# Patient Record
Sex: Female | Born: 1950 | Race: White | Hispanic: No | Smoking: Never smoker
Health system: Southern US, Community
[De-identification: ages and names within clinical notes are randomized; demographics above are authoritative.]

## PROBLEM LIST (undated history)

## (undated) DIAGNOSIS — M546 Pain in thoracic spine: Secondary | ICD-10-CM

## (undated) DIAGNOSIS — I10 Essential (primary) hypertension: Secondary | ICD-10-CM

## (undated) DIAGNOSIS — R112 Nausea with vomiting, unspecified: Secondary | ICD-10-CM

## (undated) DIAGNOSIS — G4700 Insomnia, unspecified: Secondary | ICD-10-CM

## (undated) DIAGNOSIS — I639 Cerebral infarction, unspecified: Secondary | ICD-10-CM

## (undated) DIAGNOSIS — G8929 Other chronic pain: Secondary | ICD-10-CM

## (undated) DIAGNOSIS — I6521 Occlusion and stenosis of right carotid artery: Secondary | ICD-10-CM

## (undated) DIAGNOSIS — Z9889 Other specified postprocedural states: Secondary | ICD-10-CM

## (undated) DIAGNOSIS — I319 Disease of pericardium, unspecified: Secondary | ICD-10-CM

## (undated) DIAGNOSIS — F419 Anxiety disorder, unspecified: Secondary | ICD-10-CM

## (undated) DIAGNOSIS — E785 Hyperlipidemia, unspecified: Secondary | ICD-10-CM

## (undated) DIAGNOSIS — K219 Gastro-esophageal reflux disease without esophagitis: Secondary | ICD-10-CM

## (undated) DIAGNOSIS — R42 Dizziness and giddiness: Secondary | ICD-10-CM

## (undated) DIAGNOSIS — M259 Joint disorder, unspecified: Secondary | ICD-10-CM

## (undated) DIAGNOSIS — Z860100 Personal history of colon polyps, unspecified: Secondary | ICD-10-CM

## (undated) DIAGNOSIS — K5790 Diverticulosis of intestine, part unspecified, without perforation or abscess without bleeding: Secondary | ICD-10-CM

## (undated) DIAGNOSIS — K760 Fatty (change of) liver, not elsewhere classified: Secondary | ICD-10-CM

## (undated) DIAGNOSIS — M51369 Other intervertebral disc degeneration, lumbar region without mention of lumbar back pain or lower extremity pain: Secondary | ICD-10-CM

## (undated) DIAGNOSIS — M542 Cervicalgia: Secondary | ICD-10-CM

## (undated) DIAGNOSIS — M8580 Other specified disorders of bone density and structure, unspecified site: Secondary | ICD-10-CM

## (undated) DIAGNOSIS — K5900 Constipation, unspecified: Secondary | ICD-10-CM

## (undated) DIAGNOSIS — M5020 Other cervical disc displacement, unspecified cervical region: Secondary | ICD-10-CM

## (undated) DIAGNOSIS — G2581 Restless legs syndrome: Secondary | ICD-10-CM

## (undated) DIAGNOSIS — Z973 Presence of spectacles and contact lenses: Secondary | ICD-10-CM

## (undated) DIAGNOSIS — C4490 Unspecified malignant neoplasm of skin, unspecified: Secondary | ICD-10-CM

## (undated) HISTORY — PX: EYE SURGERY: SHX253

## (undated) HISTORY — PX: TUBAL LIGATION: SHX77

## (undated) HISTORY — PX: APPENDECTOMY: SHX54

## (undated) HISTORY — PX: ELBOW SURGERY: SHX618

## (undated) HISTORY — PX: ABDOMINAL HYSTERECTOMY: SHX81

## (undated) HISTORY — DX: Pain in thoracic spine: M54.6

## (undated) HISTORY — PX: WRIST SURGERY: SHX841

## (undated) HISTORY — PX: ABDOMINAL HYSTERECTOMY W/ PARTIAL VAGINACTOMY: SUR660

---

## 1980-03-28 HISTORY — PX: TUBAL LIGATION: SHX77

## 1988-09-26 HISTORY — PX: ABDOMINAL HYSTERECTOMY: SHX81

## 2005-08-29 ENCOUNTER — Other Ambulatory Visit: Payer: Self-pay

## 2005-08-29 ENCOUNTER — Emergency Department: Payer: Self-pay | Admitting: Emergency Medicine

## 2006-08-15 ENCOUNTER — Emergency Department: Payer: Self-pay | Admitting: Emergency Medicine

## 2007-01-11 ENCOUNTER — Ambulatory Visit: Payer: Self-pay | Admitting: Gastroenterology

## 2007-07-06 ENCOUNTER — Ambulatory Visit: Payer: Self-pay | Admitting: Unknown Physician Specialty

## 2007-07-13 ENCOUNTER — Ambulatory Visit: Payer: Self-pay | Admitting: Unknown Physician Specialty

## 2007-09-27 HISTORY — PX: ELBOW SURGERY: SHX618

## 2010-06-06 HISTORY — PX: WRIST SURGERY: SHX841

## 2010-09-26 DIAGNOSIS — I319 Disease of pericardium, unspecified: Secondary | ICD-10-CM

## 2010-09-26 HISTORY — DX: Disease of pericardium, unspecified: I31.9

## 2014-04-01 ENCOUNTER — Emergency Department: Payer: Self-pay | Admitting: Emergency Medicine

## 2014-04-01 LAB — BASIC METABOLIC PANEL
ANION GAP: 6 — AB (ref 7–16)
BUN: 14 mg/dL (ref 7–18)
CO2: 30 mmol/L (ref 21–32)
Calcium, Total: 9 mg/dL (ref 8.5–10.1)
Chloride: 105 mmol/L (ref 98–107)
Creatinine: 0.86 mg/dL (ref 0.60–1.30)
EGFR (African American): >60
Glucose: 115 mg/dL — ABNORMAL HIGH (ref 65–99)
OSMOLALITY: 283 (ref 275–301)
Potassium: 3.7 mmol/L (ref 3.5–5.1)
SODIUM: 141 mmol/L (ref 136–145)

## 2014-04-01 LAB — CBC
HCT: 40.3 % (ref 35.0–47.0)
HGB: 13 g/dL (ref 12.0–16.0)
MCH: 29.3 pg (ref 26.0–34.0)
MCHC: 32.3 g/dL (ref 32.0–36.0)
MCV: 91 fL (ref 80–100)
Platelet: 189 10*3/uL (ref 150–440)
RBC: 4.45 10*6/uL (ref 3.80–5.20)
RDW: 14.3 % (ref 11.5–14.5)
WBC: 5.9 10*3/uL (ref 3.6–11.0)

## 2014-04-01 LAB — TROPONIN I

## 2014-04-02 LAB — TROPONIN I
Troponin-I: <0.02 ng/mL
Troponin-I: <0.02 ng/mL

## 2014-04-02 LAB — CK TOTAL AND CKMB (NOT AT ARMC)
CK, TOTAL: 69 U/L
CK, TOTAL: 65 U/L
CK-MB: 1.3 ng/mL (ref 0.5–3.6)
CK-MB: 1.1 ng/mL (ref 0.5–3.6)

## 2015-01-17 NOTE — Discharge Summary (Signed)
PATIENT NAME:  Shannon Petersen, Shannon Petersen MR#:  [id number] DATE OF BIRTH:  March 30, 1951  DATE OF ADMISSION:  04/01/2014 DATE OF DISCHARGE:  04/02/2014  ADMITTING DIAGNOSIS: Chest pain.   DISCHARGE DIAGNOSES: 1.  Chest pain, felt to be noncardiac, possible musculoskeletal or anxiety driven. Negative Lexi MIBI.  2.  Abdominal pain and distention of unclear etiology. CT of the abdomen and pelvis negative.  3.  Generalized anxiety disorder as well as bereavement as a result of loss of her spouse.  4.  History of diverticulosis.  5.  Depression.  6.  History of pericarditis.  7.  Status post hysterectomy.   PERTINENT LABS AND EVALUATIONS: Troponin was less than 0.02.   Chest x-ray showed no active disease. CT of the abdomen and pelvis and CT of the chest showed no evidence of PE. No thoracic aneurysm or dissection. No GI pathology noted.  Admitting glucose 115, BUN 14, creatinine 0.86, sodium 141, potassium 3.7, chloride 105, and CO2 was 30. Calcium was 9. Troponin less than 0.02 x3. WBC 5.9, hemoglobin 13, and platelet count was 189,000.   EKG: Normal sinus rhythm without any ST-T wave changes. Echocardiogram of the heart showed normal EF. Left ventricular diastolic filling was normal. No evidence of pericardial effusion. Trace mitral valve regurg. Trivial tricuspid regurgitation.   HOSPITAL COURSE: Please refer to H and P done by the admitting physician. The patient is a 64 year old white female with history of pericarditis in the past. Also had lost her husband in December and has been under a lot of stress and has been anxious and depressed. Presented with complaint of chest pain all over her chest as well as abdominal pain. The patient was admitted and serial cardiac enzymes were done, which were nonrevealing. She also underwent a CT of the chest, abdomen and pelvis which showed no acute pathology. Due to her symptoms, she underwent a Lexi MIBI, which showed no evidence of ischemia or infarct. She was also  seen in consultation by cardiology. The stress test results were negative. At this point, her pain is felt to be atypical and noncardiac-related. I strongly recommended that she follow up with psychiatry to help with her anxiety and bereavement. She also had some abdominal discomfort and distention. No pathology was evident on CT of the abdomen. At this time, the patient is doing fairly well and is stable for discharge.   DISCHARGE MEDICATIONS: Alprazolam 0.25 at bedtime, gabapentin 100 at bedtime, hydrochlorothiazide 25 daily, alprazolam 0.25 one tab p.o. q. 8 p.r.n. anxiety, ranitidine 150 one  tab p.o. q. 12, promethazine 12.5 q. 6 p.r.n. nausea.  TIME SPENT ON DISCHARGE: 35 minutes.  ____________________________ Lacie ScottsShreyang H. Allena KatzPatel, MD shp:sb D: 04/03/2014 08:11:29 ET T: 04/03/2014 08:47:06 ET JOB#: [id number]  cc: Shannon Petersen H. Allena KatzPatel, MD, <Dictator> Charise CarwinSHREYANG H Winta Barcelo MD ELECTRONICALLY SIGNED 04/09/2014 8:41

## 2015-01-17 NOTE — H&P (Signed)
PATIENT NAME:  Shannon Petersen, Shannon Petersen MR#:  [id number] DATE OF BIRTH:  03-01-1951  DATE OF ADMISSION:  04/01/2014  PRIMARY CARE PHYSICIAN: Nonlocal.   REFERRING PHYSICIAN: Dr. Manson Passey.   CHIEF COMPLAINT: Chest pain.   HISTORY OF PRESENT ILLNESS: The patient is a 64 year old Caucasian female with a past medical history of diverticulosis, acute depression after her husband was deceased in 25-Sep-2013 is presenting to the ED with a chief complaint of left shoulder pain and chest pain. The patient is reporting that at around 1:30 p.m. yesterday, she felt a burning sensation in her throat. Subsequently she started having chest pain in the evening. The chest pain was sharp in nature, radiating to the left shoulder and axillary area, a 6/10, intermittent in nature. Associated with nausea and vomited once. She felt short of breath, but denies any dizziness or loss of consciousness. The patient was seen by Dr. Gwen Pounds in the past for pericarditis she had exercise stress test done approximately 1-2 years ago, which was normal, as reported by the patient. At the ED, as the patient was complaining of chest pain and abdominal pain, she had a CAT scan of the chest, abdomen, and pelvis done. Pulmonary embolism was ruled out and dissection was also ruled out. She was given morphine for pain management which has improved the chest pain. The patient is still uncomfortable with right lower quadrant abdominal pain. Denies any diarrhea or constipation or other complaints. No recent travel. Her boyfriend is at bedside.   PAST MEDICAL HISTORY: Diverticulosis, depression, history of pericarditis.   PAST SURGICAL HISTORY: Hysterectomy.   ALLERGIES: No known drug allergies.   PSYCHOSOCIAL HISTORY: Three grandchildren are living with her. Her husband was deceased on 10-18-2013. Since then, the patient was in bereavement and depressed with significant weight loss. Denies any alcohol or illicit drug usage.   FAMILY HISTORY:  Mother deceased with stroke.   HOME MEDICATIONS: Gabapentin, dose unknown, alprazolam orally as needed, dose unknown.  REVIEW OF SYSTEMS:  CONSTITUTIONAL: Denies any fever or fatigue.  HEENT: Denies blurry vision, double vision, epistaxis. Denies tinnitus, discharge, epistaxis.  RESPIRATORY: Denies cough, COPD.  CARDIOVASCULAR: Currently complaining of chest pain. Denies any palpitations or dizziness, associated with shortness of breath. GASTROINTESTINAL: Complaining of nausea and vomiting. Denies any diarrhea, melena, or hematemesis.  ENDOCRINE: Denies any polyuria, nocturia, or thyroid problems. Had significant weight loss after husband was deceased because of depression. Denies any weight gain.  HEMATOLOGIC AND LYMPHATIC: No anemia, easy bruising, or bleeding.  INTEGUMENTARY: No acne, rash, lesions.  MUSCULOSKELETAL: Denies any neck pain or back pain. Complaining of left shoulder pain. Denies any gout.  PSYCHIATRIC: Flat mood and affect. Admits a lot of stress.   PHYSICAL EXAMINATION:  VITAL SIGNS: Temperature 98.9, pulse 75, respirations 14, blood pressure 142/90, pulse oximetry 100% on room air.  GENERAL APPEARANCE: Not in acute distress. Moderately built and thin-looking female.  HEENT: Normocephalic, atraumatic. Pupils are equally reacting to light and accommodation. No scleral icterus. No conjunctival injection. No sinus tenderness. No postnasal drip. Moist mucous membranes.  NECK: Supple. No JVD. No thyromegaly. Range of motion is intact.  LUNGS: Clear to auscultation bilaterally. No accessory muscle usage. No anterior chest wall tenderness on palpation.  CARDIAC: S1 and S2 normal. Regular rate and rhythm.  GASTROINTESTINAL: Soft. Bowel sounds are positive in all 4 quadrants. Has right lower quadrant tenderness. No rebound tenderness. No masses felt. Seemed to be distended.  NEUROLOGIC: Awake, alert, oriented x 3. Cranial nerves II through  XII are grossly intact. Motor and sensory  are intact. Reflexes are 2+.  EXTREMITIES: No edema. No cyanosis. No clubbing.  SKIN: Warm to touch. Normal turgor. No rashes, no lesions. MUSCULOSKELETAL: No joint effusion, tenderness, or erythema. PSYCHIATRIC: Flat mood and affect.   LABORATORIES AND IMAGING STUDIES: CBC is normal. Troponin less than 0.02. Chem-8 is normal except for glucose at 159, anion gap at 6.   Chest x-ray, portable: No active disease. CT of the abdomen and the chest and CT abdomen and pelvis with contrast revealed no evidence of a thoracoabdominal aortic aneurysm or dissection. No evidence of significant pulmonary embolism. No acute abnormality demonstrated in the chest, abdomen, or pelvis. Twelve lead EKG: Normal sinus rhythm at 85 beats per minute, nonspecific T wave changes in leads V2 to V3. No acute ST-T wave changes   ASSESSMENT AND PLAN: A 64 year old female who is depressed after her husband was deceased in December and had significant weight loss from depression, is presenting to the ED with a chief complaint of a burning sensation in the throat followed by chest pain radiating to the left side of the shoulder.   ASSESSMENT AND PLAN:  1. Chest pain, rule out acute coronary syndrome. We will admit her to telemetry. Cycle cardiac biomarkers. Will keep her n.p.o. and provide IV fluids. Myoview test is ordered. Will implement ACS protocol. 2. The patient was seen by Dr. Gwen PoundsKowalski in the past. We will consult case with cardiology group.  3. Right lower quadrant abdominal pain, etiology is unclear. Could be from diverticulosis but CAT scan of the abdomen is normal.  4. Chronic history of diverticulosis CT of the abdomen is normal.  5. Acute depression and bereavement reaction following husband's death. Denies any suicidal ideations or homicidal ideation. We will continue close monitoring. If necessary, we will consider psych consult.  6. We will provide gastrointestinal and deep vein thrombosis prophylaxis.   CODE  STATUS: She is full code. Son is her medical power of attorney.   Plan of care discussed with the patient and her boyfriend at bedside. They both verbalized understanding of the plan.    ____________________________ Ramonita LabAruna Kena Limon, MD ag:lt D: 04/02/2014 02:45:58 ET T: 04/02/2014 03:35:02 ET JOB#: [id number]  cc: Ramonita LabAruna Karah Caruthers, MD, <Dictator> [facility] Ramonita LabARUNA Rocklyn Mayberry MD ELECTRONICALLY SIGNED 04/12/2014 2:12

## 2015-01-17 NOTE — Consult Note (Signed)
PATIENT NAME:  Shannon Petersen, [name] MR#:  [id number] DATE OF BIRTH:  1951-02-06  DATE OF CONSULTATION:  04/02/2014  REFERRING PHYSICIAN:  Ramonita LabAruna Gouru, MD.  CONSULTING PHYSICIAN:  Marcina MillardAlexander Juliett Eastburn, MD  CHIEF COMPLAINT: Chest pain.   HISTORY OF PRESENT ILLNESS: The patient is a 64 year old female with a history of depression following the death of her husband, who [facility] with chest discomfort and left shoulder pain. The patient reports that she was in her usual state of health until the day prior to admission when she noted a burning sensation in her throat followed by chest discomfort with radiation to her left shoulder and left axilla. The patient has a history of pericarditis approximately 2 years ago. CT scan was performed, which ruled out pulmonary embolus. Admission laboratory studies were notable for a negative troponin, less than 0.02.   PAST MEDICAL HISTORY:  1.  Pericarditis.  2.  Depression. 3.  Diverticulosis.   MEDICATIONS: Alprazolam and gabapentin.   SOCIAL HISTORY: The patient is a widow. She lives with 3 grandchildren.   FAMILY HISTORY: No immediate family history for coronary artery disease or myocardial infarction.   REVIEW OF SYSTEMS:  CONSTITUTIONAL: No fever or chills.  EYES: No blurry vision.  EARS: No hearing loss.  RESPIRATORY: No shortness of breath.  CARDIOVASCULAR: The patient has chest pain as described above.  GASTROINTESTINAL: The patient has had some nausea and vomiting.  GENITOURINARY: No dysuria or hematuria.  ENDOCRINE: No polyuria or polydipsia.  MUSCULOSKELETAL: No arthralgias or myalgias.  NEUROLOGICAL: No focal muscle weakness or numbness.  PSYCHOLOGICAL: Depression.   PHYSICAL EXAMINATION:  HEENT: Pupils equal, reactive to light and accommodation.  NECK: Supple without thyromegaly.  LUNGS: Clear.  HEART: Normal JVP. Normal PMI. Regular rate and rhythm. Normal S1, S2. No appreciable gallop, murmur, or rub.  ABDOMEN:  Soft and nontender. Pulses were intact bilaterally.  MUSCULOSKELETAL: Normal muscle tone.  NEUROLOGIC: The patient is alert and oriented x3. Motor and sensory both grossly intact.   IMPRESSION: A 64 year old female who presents with chest pain with nondiagnostic EKG and negative troponin.   RECOMMENDATIONS:  1.  Agree with current therapy.  2.  Would defer full dose anticoagulation.  3.  Proceed with nuclear functional study.  4.  Further recommendations pending results of nuclear study.    ____________________________ Marcina MillardAlexander Kenyotta Dorfman, MD ap:lt D: 04/02/2014 10:12:36 ET T: 04/02/2014 10:37:47 ET JOB#: [id number]  cc: Marcina MillardAlexander Vedh Ptacek, MD, <Dictator> Marcina MillardALEXANDER Sahana Boyland MD ELECTRONICALLY SIGNED 05/06/2014 13:54

## 2015-07-02 IMAGING — NM MYOCARDIAL PERFUSION
8 series · 48 of 48 positions shown · non-contrast
Comparison: none

[Series 1000: stress-gated (recon) · 4.8mm · 4.80mm/px · 6 of 312 frames shown]
[frame 27/312]
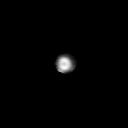
[frame 79/312]
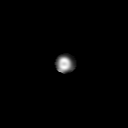
[frame 131/312]
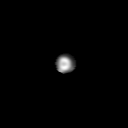
[frame 183/312]
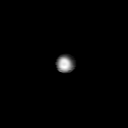
[frame 235/312]
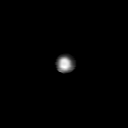
[frame 287/312]
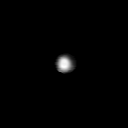

[Series 1000: rest_dc · 4.80mm/px · 6 of 68 frames shown]
[frame 6/68]
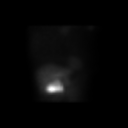
[frame 17/68]
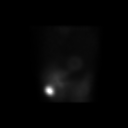
[frame 29/68]
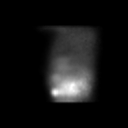
[frame 40/68]
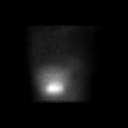
[frame 51/68]
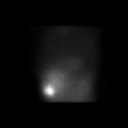
[frame 63/68]
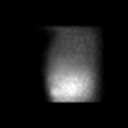

[Series 1000: stress (recon - ac ) · 4.8mm · 4.80mm/px · 6 of 34 frames shown]
[frame 3/34]
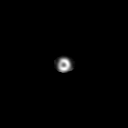
[frame 9/34]
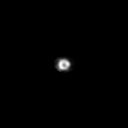
[frame 15/34]
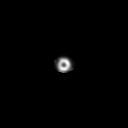
[frame 20/34]
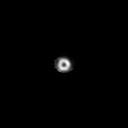
[frame 26/34]
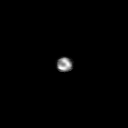
[frame 32/34]
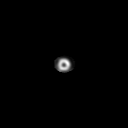

[Series 1000: stress-gated_dc · 4.80mm/px · 6 of 544 frames shown]
[frame 46/544]
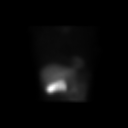
[frame 136/544]
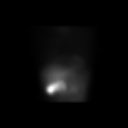
[frame 227/544]
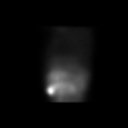
[frame 318/544]
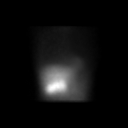
[frame 408/544]
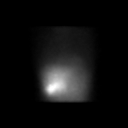
[frame 499/544]
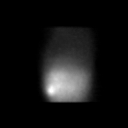

[Series 1000: rest (recon - noac ) · 4.8mm · 4.80mm/px · 6 of 36 frames shown]
[frame 4/36]
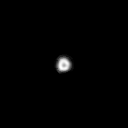
[frame 10/36]
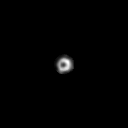
[frame 16/36]
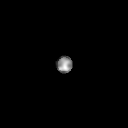
[frame 22/36]
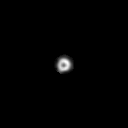
[frame 28/36]
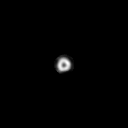
[frame 34/36]
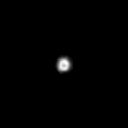

[Series 1000: stress (recon - noac ) · 4.8mm · 4.80mm/px · 6 of 34 frames shown]
[frame 3/34]
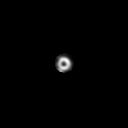
[frame 9/34]
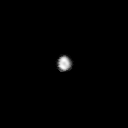
[frame 15/34]
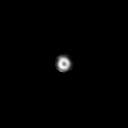
[frame 20/34]
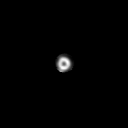
[frame 26/34]
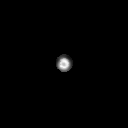
[frame 32/34]
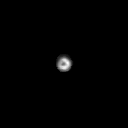

[Series 1000: rest (recon - ac ) · 4.8mm · 4.80mm/px · 6 of 33 frames shown]
[frame 3/33]
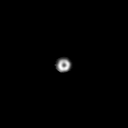
[frame 9/33]
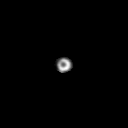
[frame 14/33]
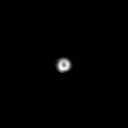
[frame 20/33]
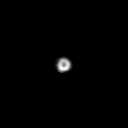
[frame 25/33]
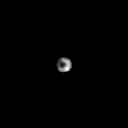
[frame 31/33]
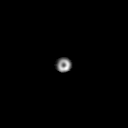

[Series 1000: stress_dc · 4.80mm/px · 6 of 68 frames shown]
[frame 6/68]
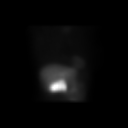
[frame 17/68]
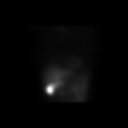
[frame 29/68]
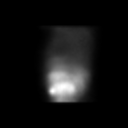
[frame 40/68]
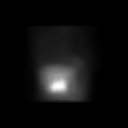
[frame 51/68]
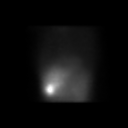
[frame 63/68]
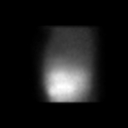

[48 of 48 positions shown; findings below may reference images not displayed]

**** An original report or order could not be provided from the [HOSPITAL] [facility] ****

## 2016-08-14 ENCOUNTER — Ambulatory Visit
Admission: EM | Admit: 2016-08-14 | Discharge: 2016-08-14 | Disposition: A | Discharge status: Home / Self Care | Payer: Medicare Other | Attending: Family Medicine | Admitting: Family Medicine

## 2016-08-14 DIAGNOSIS — J0100 Acute maxillary sinusitis, unspecified: Secondary | ICD-10-CM | POA: Diagnosis not present

## 2016-08-14 DIAGNOSIS — J209 Acute bronchitis, unspecified: Secondary | ICD-10-CM | POA: Diagnosis not present

## 2016-08-14 HISTORY — DX: Restless legs syndrome: G25.81

## 2016-08-14 HISTORY — DX: Essential (primary) hypertension: I10

## 2016-08-14 HISTORY — DX: Gastro-esophageal reflux disease without esophagitis: K21.9

## 2016-08-14 MED ORDER — ALBUTEROL SULFATE HFA 108 (90 BASE) MCG/ACT IN AERS: 2.0000 | INHALATION_SPRAY | Freq: Four times a day (QID) | RESPIRATORY_TRACT | 0 refills | Status: DC | PRN

## 2016-08-14 MED ORDER — AMOXICILLIN-POT CLAVULANATE 875-125 MG PO TABS: 1.0000 | ORAL_TABLET | Freq: Two times a day (BID) | ORAL | 0 refills | Status: AC

## 2016-08-14 MED ORDER — BENZONATATE 100 MG PO CAPS: 100.0000 mg | ORAL_CAPSULE | Freq: Three times a day (TID) | ORAL | 0 refills | Status: DC | PRN

## 2016-08-14 NOTE — ED Provider Notes (Signed)
CSN: [id number]     Arrival date & time 08/14/16  1409 History   First MD Initiated Contact with Patient 08/14/16 1533     Chief Complaint  Patient presents with  . Cough   (Consider location/radiation/quality/duration/timing/severity/associated sxs/prior Treatment) 65 year old female presents with cough, chest congestion, chest tightness, left ear pain, headache, sinus pressure for the past 4 days- getting worse. Also have had fevers up to 102 this morning. No nausea but vomited due to coughing. Has taken Tylenol with minimal relief. Besides medication listed, she is also on 2 blood pressure medications - does not know name of medication.   The history is provided by the patient.    Past Medical History:  Diagnosis Date  . Acid reflux   . Hypertension   . Restless leg    Past Surgical History:  Procedure Laterality Date  . ABDOMINAL HYSTERECTOMY     History reviewed. No pertinent family history. Social History  Substance Use Topics  . Smoking status: Never Smoker  . Smokeless tobacco: Never Used  . Alcohol use No   OB History    No data available     Review of Systems  Constitutional: Positive for chills, fatigue and fever. Negative for appetite change.  HENT: Positive for congestion, ear pain, rhinorrhea, sinus pain, sinus pressure and sore throat.   Eyes: Negative for discharge.  Respiratory: Positive for cough, chest tightness, shortness of breath and wheezing.   Cardiovascular: Negative for chest pain.  Gastrointestinal: Positive for vomiting. Negative for abdominal pain, diarrhea and nausea.  Musculoskeletal: Negative for neck pain and neck stiffness.  Skin: Negative for rash.  Neurological: Positive for light-headedness and headaches. Negative for dizziness, syncope and weakness.  Hematological: Negative for adenopathy.    Allergies  Patient has no known allergies.  Home Medications   Prior to Admission medications   Medication Sig Start Date End Date  Taking? Authorizing Provider  gabapentin (NEURONTIN) 300 MG capsule Take 300 mg by mouth 3 (three) times daily.   Yes Historical Provider, MD  omeprazole (PRILOSEC) 20 MG capsule Take 20 mg by mouth daily.   Yes Historical Provider, MD  albuterol (PROVENTIL HFA;VENTOLIN HFA) 108 (90 Base) MCG/ACT inhaler Inhale 2 puffs into the lungs every 6 (six) hours as needed for wheezing or shortness of breath. 08/14/16   Sudie GrumblingAnn Berry Sheron Tallman, NP  amoxicillin-clavulanate (AUGMENTIN) 875-125 MG tablet Take 1 tablet by mouth every 12 (twelve) hours. 08/14/16 08/21/16  Sudie GrumblingAnn Berry Anjel Pardo, NP  benzonatate (TESSALON) 100 MG capsule Take 1 capsule (100 mg total) by mouth 3 (three) times daily as needed for cough. 08/14/16   Sudie GrumblingAnn Berry Akyra Bouchie, NP   Meds Ordered and Administered this Visit  Medications - No data to display  BP (!) 131/92   Pulse 84   Temp 99.2 F (37.3 C) (Oral)   Resp 20   Ht 4' 11.5" (1.511 m)   Wt 127 lb (57.6 kg)   SpO2 99%   BMI 25.22 kg/m  No data found.   Physical Exam  Constitutional: She is oriented to person, place, and time. She appears well-developed and well-nourished. She appears ill. No distress.  HENT:  Head: Normocephalic and atraumatic.  Right Ear: Hearing, external ear and ear canal normal. Tympanic membrane is bulging.  Left Ear: Hearing, external ear and ear canal normal. Tympanic membrane is bulging.  Nose: Mucosal edema and rhinorrhea present. Right sinus exhibits maxillary sinus tenderness. Right sinus exhibits no frontal sinus tenderness. Left sinus exhibits maxillary sinus  tenderness. Left sinus exhibits no frontal sinus tenderness.  Mouth/Throat: Uvula is midline and mucous membranes are normal. Posterior oropharyngeal erythema present.  Neck: Normal range of motion. Neck supple.  Cardiovascular: Normal rate, regular rhythm and normal heart sounds.   Pulmonary/Chest: Effort normal. She has decreased breath sounds in the right upper field and the left upper field. She  has no wheezes. She has rhonchi in the right upper field and the left upper field.  Lymphadenopathy:    She has no cervical adenopathy.  Neurological: She is alert and oriented to person, place, and time.  Skin: Skin is warm and dry.  Psychiatric: She has a normal mood and affect. Her behavior is normal. Judgment and thought content normal.    Urgent Care Course   Clinical Course     Procedures (including critical care time)  Labs Review Labs Reviewed - No data to display  Imaging Review No results found.   Visual Acuity Review  Right Eye Distance:   Left Eye Distance:   Bilateral Distance:    Right Eye Near:   Left Eye Near:    Bilateral Near:         MDM   1. Acute bronchitis, unspecified organism   2. Acute non-recurrent maxillary sinusitis    Recommend Augmentin 875mg  twice a day as directed. Take Tessalon cough pills 1 every 8 hours as needed for cough. Use Albuterol inhaler 2 puffs every 6 hours as needed for wheezing and cough. Increase fluid intake to help loosen up mucus. Continue Tylenol as needed for fever and pain. Follow-up with her primary care provider in 3 to 4 days if not improving.      Sudie GrumblingAnn Berry December Hedtke, NP 08/15/16 [phone]

## 2016-08-14 NOTE — Discharge Instructions (Signed)
Start Augmentin twice a day. Take Tessalon cough pills 1 every 8 hours as needed for cough. Use Albuterol inhaler 2 puffs every 6 hours as needed for cough and wheezing. May continue Tylenol as needed for fever and pain. Follow-up with your primary care provider in 3 days if not improving.

## 2016-08-14 NOTE — ED Triage Notes (Addendum)
Pt reports coughing up green phlegm x Thursday but now is non-productive. Has been running fevers. Pain around bra strap area feels burning in nature much worse with coughing. Left ear pain

## 2017-01-12 ENCOUNTER — Encounter: Payer: Self-pay | Admitting: *Deleted

## 2017-01-13 NOTE — Discharge Instructions (Signed)
Cataract Surgery, Care After °Refer to this sheet in the next few weeks. These instructions provide you with information about caring for yourself after your procedure. Your health care provider may also give you more specific instructions. Your treatment has been planned according to current medical practices, but problems sometimes occur. Call your health care provider if you have any problems or questions after your procedure. °What can I expect after the procedure? °After the procedure, it is common to have: °· Itching. °· Discomfort. °· Fluid discharge. °· Sensitivity to light and to touch. °· Bruising. °Follow these instructions at home: °Eye Care  °· Check your eye every day for signs of infection. Watch for: °¨ Redness, swelling, or pain. °¨ Fluid, blood, or pus. °¨ Warmth. °¨ Bad smell. °Activity  °· Avoid strenuous activities, such as playing contact sports, for as long as told by your health care provider. °· Do not drive or operate heavy machinery until your health care provider approves. °· Do not bend or lift heavy objects . Bending increases pressure in the eye. You can walk, climb stairs, and do light household chores. °· Ask your health care provider when you can return to work. If you work in a dusty environment, you may be advised to wear protective eyewear for a period of time. °General instructions  °· Take or apply over-the-counter and prescription medicines only as told by your health care provider. This includes eye drops. °· Do not touch or rub your eyes. °· If you were given a protective shield, wear it as told by your health care provider. If you were not given a protective shield, wear sunglasses as told by your health care provider to protect your eyes. °· Keep the area around your eye clean and dry. Avoid swimming or allowing water to hit you directly in the face while showering until told by your health care provider. Keep soap and shampoo out of your eyes. °· Do not put a contact lens  into the affected eye or eyes until your health care provider approves. °· Keep all follow-up visits as told by your health care provider. This is important. °Contact a health care provider if: ° °· You have increased bruising around your eye. °· You have pain that is not helped with medicine. °· You have a fever. °· You have redness, swelling, or pain in your eye. °· You have fluid, blood, or pus coming from your incision. °· Your vision gets worse. °Get help right away if: °· You have sudden vision loss. °This information is not intended to replace advice given to you by your health care provider. Make sure you discuss any questions you have with your health care provider. °Document Released: 04/01/2005 Document Revised: 01/21/2016 Document Reviewed: 07/23/2015 °Elsevier Interactive Patient Education © 2017 [employer] ° ° ° ° °General Anesthesia, Adult, Care After °These instructions provide you with information about caring for yourself after your procedure. Your health care provider may also give you more specific instructions. Your treatment has been planned according to current medical practices, but problems sometimes occur. Call your health care provider if you have any problems or questions after your procedure. °What can I expect after the procedure? °After the procedure, it is common to have: °· Vomiting. °· A sore throat. °· Mental slowness. °It is common to feel: °· Nauseous. °· Cold or shivery. °· Sleepy. °· Tired. °· Sore or achy, even in parts of your body where you did not have surgery. °Follow these instructions at   home: °For at least 24 hours after the procedure:  °· Do not: °¨ Participate in activities where you could fall or become injured. °¨ Drive. °¨ Use heavy machinery. °¨ Drink alcohol. °¨ Take sleeping pills or medicines that cause drowsiness. °¨ Make important decisions or sign legal documents. °¨ Take care of children on your own. °· Rest. °Eating and drinking  °· If you vomit, drink  water, juice, or soup when you can drink without vomiting. °· Drink enough fluid to keep your urine clear or pale yellow. °· Make sure you have little or no nausea before eating solid foods. °· Follow the diet recommended by your health care provider. °General instructions  °· Have a responsible adult stay with you until you are awake and alert. °· Return to your normal activities as told by your health care provider. Ask your health care provider what activities are safe for you. °· Take over-the-counter and prescription medicines only as told by your health care provider. °· If you smoke, do not smoke without supervision. °· Keep all follow-up visits as told by your health care provider. This is important. °Contact a health care provider if: °· You continue to have nausea or vomiting at home, and medicines are not helpful. °· You cannot drink fluids or start eating again. °· You cannot urinate after 8-12 hours. °· You develop a skin rash. °· You have fever. °· You have increasing redness at the site of your procedure. °Get help right away if: °· You have difficulty breathing. °· You have chest pain. °· You have unexpected bleeding. °· You feel that you are having a life-threatening or urgent problem. °This information is not intended to replace advice given to you by your health care provider. Make sure you discuss any questions you have with your health care provider. °Document Released: 12/19/2000 Document Revised: 02/15/2016 Document Reviewed: 08/27/2015 °Elsevier Interactive Patient Education © 2017 [employer]vier Inc[employer] ° °

## 2017-01-17 ENCOUNTER — Ambulatory Visit: Payer: Medicare Other | Admitting: Anesthesiology

## 2017-01-17 ENCOUNTER — Encounter
Admission: RE | Disposition: A | Discharge status: Home / Self Care | Payer: Self-pay | Source: Ambulatory Visit | Attending: Ophthalmology

## 2017-01-17 ENCOUNTER — Ambulatory Visit
Admission: RE | Admit: 2017-01-17 | Discharge: 2017-01-17 | Disposition: A | Discharge status: Home / Self Care | Payer: Medicare Other | Source: Ambulatory Visit | Attending: Ophthalmology | Admitting: Ophthalmology

## 2017-01-17 DIAGNOSIS — H2512 Age-related nuclear cataract, left eye: Secondary | ICD-10-CM | POA: Insufficient documentation

## 2017-01-17 DIAGNOSIS — I10 Essential (primary) hypertension: Secondary | ICD-10-CM | POA: Diagnosis not present

## 2017-01-17 DIAGNOSIS — K219 Gastro-esophageal reflux disease without esophagitis: Secondary | ICD-10-CM | POA: Insufficient documentation

## 2017-01-17 DIAGNOSIS — Z79899 Other long term (current) drug therapy: Secondary | ICD-10-CM | POA: Diagnosis not present

## 2017-01-17 HISTORY — DX: Presence of spectacles and contact lenses: Z97.3

## 2017-01-17 HISTORY — DX: Other cervical disc displacement, unspecified cervical region: M50.20

## 2017-01-17 HISTORY — DX: Other specified postprocedural states: R11.2

## 2017-01-17 HISTORY — PX: CATARACT EXTRACTION W/PHACO: SHX586

## 2017-01-17 HISTORY — DX: Joint disorder, unspecified: M25.9

## 2017-01-17 HISTORY — DX: Disease of pericardium, unspecified: I31.9

## 2017-01-17 HISTORY — DX: Other specified postprocedural states: Z98.890

## 2017-01-17 SURGERY — PHACOEMULSIFICATION, CATARACT, WITH IOL INSERTION
Anesthesia: Monitor Anesthesia Care | Laterality: Left | Wound class: Clean

## 2017-01-17 MED ORDER — SODIUM HYALURONATE 10 MG/ML IO SOLN
INTRAOCULAR | Status: DC | PRN
  Administered 2017-01-17: 0.55 mL via INTRAOCULAR

## 2017-01-17 MED ORDER — FENTANYL CITRATE (PF) 100 MCG/2ML IJ SOLN
INTRAMUSCULAR | Status: DC | PRN
  Administered 2017-01-17 (×2): 50 ug via INTRAVENOUS

## 2017-01-17 MED ORDER — ARMC OPHTHALMIC DILATING DROPS
1.0000 "application " | OPHTHALMIC | Status: DC | PRN
  Administered 2017-01-17 (×3): 1 via OPHTHALMIC

## 2017-01-17 MED ORDER — LACTATED RINGERS IV SOLN: INTRAVENOUS | Status: DC

## 2017-01-17 MED ORDER — MOXIFLOXACIN HCL 0.5 % OP SOLN
OPHTHALMIC | Status: DC | PRN
  Administered 2017-01-17: 0.2 mL via OPHTHALMIC

## 2017-01-17 MED ORDER — SODIUM HYALURONATE 23 MG/ML IO SOLN
INTRAOCULAR | Status: DC | PRN
  Administered 2017-01-17: 0.6 mL via INTRAOCULAR

## 2017-01-17 MED ORDER — MIDAZOLAM HCL 2 MG/2ML IJ SOLN
INTRAMUSCULAR | Status: DC | PRN
  Administered 2017-01-17: 2 mg via INTRAVENOUS

## 2017-01-17 MED ORDER — EPINEPHRINE PF 1 MG/ML IJ SOLN
INTRAMUSCULAR | Status: DC | PRN
  Administered 2017-01-17: 89 mL via OPHTHALMIC

## 2017-01-17 MED ORDER — LIDOCAINE HCL (PF) 2 % IJ SOLN
INTRAOCULAR | Status: DC | PRN
  Administered 2017-01-17: 1 mL via INTRAOCULAR

## 2017-01-17 SURGICAL SUPPLY — 16 items
CANNULA ANT/CHMB 27GA (MISCELLANEOUS) ×2 IMPLANT
DISSECTOR HYDRO NUCLEUS 50X22 (MISCELLANEOUS) ×2 IMPLANT
GLOVE BIO SURGEON STRL SZ8 (GLOVE) ×2 IMPLANT
GLOVE SURG LX 7.5 STRW (GLOVE) ×1
GLOVE SURG LX STRL 7.5 STRW (GLOVE) ×1 IMPLANT
GOWN STRL REUS W/ TWL LRG LVL3 (GOWN DISPOSABLE) ×2 IMPLANT
GOWN STRL REUS W/TWL LRG LVL3 (GOWN DISPOSABLE) ×2
LENS IOL SYMFONY 12.5 ×2 IMPLANT
MARKER SKIN DUAL TIP RULER LAB (MISCELLANEOUS) ×2 IMPLANT
PACK CATARACT (MISCELLANEOUS) ×2 IMPLANT
PACK DR. KING ARMS (PACKS) ×2 IMPLANT
PACK EYE AFTER SURG (MISCELLANEOUS) ×2 IMPLANT
SYR 3ML LL SCALE MARK (SYRINGE) ×2 IMPLANT
SYR TB 1ML LUER SLIP (SYRINGE) ×2 IMPLANT
WATER STERILE IRR 500ML POUR (IV SOLUTION) ×2 IMPLANT
WIPE NON LINTING 3.25X3.25 (MISCELLANEOUS) ×2 IMPLANT

## 2017-01-17 NOTE — Transfer of Care (Signed)
Immediate Anesthesia Transfer of Care Note  Patient: Shannon Petersen  Procedure(s) Performed: Procedure(s) with comments: CATARACT EXTRACTION PHACO AND INTRAOCULAR LENS PLACEMENT (IOC) left symfony lens (Left) - symfony lens  Patient Location: PACU  Anesthesia Type: MAC  Level of Consciousness: awake, alert  and patient cooperative  Airway and Oxygen Therapy: Patient Spontanous Breathing and Patient connected to supplemental oxygen  Post-op Assessment: Post-op Vital signs reviewed, Patient's Cardiovascular Status Stable, Respiratory Function Stable, Patent Airway and No signs of Nausea or vomiting  Post-op Vital Signs: Reviewed and stable  Complications: No apparent anesthesia complications

## 2017-01-17 NOTE — Anesthesia Postprocedure Evaluation (Signed)
Anesthesia Post Note  Patient: Shannon Petersen  Procedure(s) Performed: Procedure(s) (LRB): CATARACT EXTRACTION PHACO AND INTRAOCULAR LENS PLACEMENT (IOC) left symfony lens (Left)  Patient location during evaluation: PACU Anesthesia Type: MAC Level of consciousness: awake and alert Pain management: pain level controlled Vital Signs Assessment: post-procedure vital signs reviewed and stable Respiratory status: spontaneous breathing, nonlabored ventilation, respiratory function stable and patient connected to nasal cannula oxygen Cardiovascular status: stable and blood pressure returned to baseline Anesthetic complications: no    Marshell Levan

## 2017-01-17 NOTE — H&P (Signed)
The History and Physical notes are on paper, have been signed, and are to be scanned.   I have examined the patient and there are no changes to the H&P.   Willey Blade 01/17/2017 10:17 AM

## 2017-01-17 NOTE — Op Note (Signed)
OPERATIVE NOTE  Shannon Petersen [id number] 01/17/2017   PREOPERATIVE DIAGNOSIS:  Nuclear sclerotic cataract left eye.  H25.12   POSTOPERATIVE DIAGNOSIS:    Nuclear sclerotic cataract left eye.     PROCEDURE:  Phacoemusification with posterior chamber intraocular lens placement of the left eye   LENS:   Implant Name Type Inv. Item Serial No. Manufacturer Lot No. LRB No. Used  ZXROO 12.5D Symfony lens     [id number] [facility]   Left 1       ZXR00 +12.5   ULTRASOUND TIME: 0 minutes 11.5 seconds.  CDE 1.17   SURGEON:  Willey Blade, MD, MPH   ANESTHESIA:  Topical with tetracaine drops augmented with 1% preservative-free intracameral lidocaine.  ESTIMATED BLOOD LOSS: <1 mL   COMPLICATIONS:  None.   DESCRIPTION OF PROCEDURE:  The patient was identified in the holding room and transported to the operating room and placed in the supine position under the operating microscope.  The left eye was identified as the operative eye and it was prepped and draped in the usual sterile ophthalmic fashion.   A 1.0 millimeter clear-corneal paracentesis was made at the 5:00 position. 0.5 ml of preservative-free 1% lidocaine with epinephrine was injected into the anterior chamber.  The anterior chamber was filled with Healon 5 viscoelastic.  A 2.4 millimeter keratome was used to make a near-clear corneal incision at the 2:00 position.  A curvilinear capsulorrhexis was made with a cystotome and capsulorrhexis forceps.  Balanced salt solution was used to hydrodissect and hydrodelineate the nucleus.   Phacoemulsification was then used in stop and chop fashion to remove the lens nucleus and epinucleus.  The remaining cortex was then removed using the irrigation and aspiration handpiece. Healon was then placed into the capsular bag to distend it for lens placement.  A lens was then injected into the capsular bag.  The remaining viscoelastic was aspirated.  Wounds were hydrated with balanced salt solution.  The  anterior chamber was inflated to a physiologic pressure with balanced salt solution.  The lens was well centered and confirmed by the purkinje images.   Intracameral vigamox 0.1 mL undiltued was injected into the eye and a drop placed onto the ocular surface.  No wound leaks were noted.  The patient was taken to the recovery room in stable condition without complications of anesthesia or surgery  Willey Blade 01/17/2017, 10:58 AM

## 2017-01-17 NOTE — Anesthesia Preprocedure Evaluation (Signed)
Anesthesia Evaluation  Patient identified by MRN, date of birth, ID band Patient awake    Airway Mallampati: I  TM Distance: >3 FB Neck ROM: Full    Dental   Pulmonary    Pulmonary exam normal        Cardiovascular hypertension, Pt. on medications Normal cardiovascular exam     Neuro/Psych    GI/Hepatic GERD  Medicated and Controlled,  Endo/Other    Renal/GU      Musculoskeletal   Abdominal   Peds  Hematology   Anesthesia Other Findings   Reproductive/Obstetrics                             Anesthesia Physical  Anesthesia Plan  ASA: II  Anesthesia Plan: MAC   Post-op Pain Management:    Induction: Intravenous  Airway Management Planned:   Additional Equipment:   Intra-op Plan:   Post-operative Plan:   Informed Consent: I have reviewed the patients History and Physical, chart, labs and discussed the procedure including the risks, benefits and alternatives for the proposed anesthesia with the patient or authorized representative who has indicated his/her understanding and acceptance.     Plan Discussed with: CRNA  Anesthesia Plan Comments:         Anesthesia Quick Evaluation  

## 2017-01-17 NOTE — Progress Notes (Signed)
Patient's friend present, unable to ask psychosocial & suicide questions at this time

## 2017-01-18 ENCOUNTER — Encounter: Payer: Self-pay | Admitting: Ophthalmology

## 2017-02-06 ENCOUNTER — Encounter: Payer: Self-pay | Admitting: *Deleted

## 2017-02-10 NOTE — Discharge Instructions (Signed)
Cataract Surgery, Care After °Refer to this sheet in the next few weeks. These instructions provide you with information about caring for yourself after your procedure. Your health care provider may also give you more specific instructions. Your treatment has been planned according to current medical practices, but problems sometimes occur. Call your health care provider if you have any problems or questions after your procedure. °What can I expect after the procedure? °After the procedure, it is common to have: °· Itching. °· Discomfort. °· Fluid discharge. °· Sensitivity to light and to touch. °· Bruising. °Follow these instructions at home: °Eye Care  °· Check your eye every day for signs of infection. Watch for: °¨ Redness, swelling, or pain. °¨ Fluid, blood, or pus. °¨ Warmth. °¨ Bad smell. °Activity  °· Avoid strenuous activities, such as playing contact sports, for as long as told by your health care provider. °· Do not drive or operate heavy machinery until your health care provider approves. °· Do not bend or lift heavy objects . Bending increases pressure in the eye. You can walk, climb stairs, and do light household chores. °· Ask your health care provider when you can return to work. If you work in a dusty environment, you may be advised to wear protective eyewear for a period of time. °General instructions  °· Take or apply over-the-counter and prescription medicines only as told by your health care provider. This includes eye drops. °· Do not touch or rub your eyes. °· If you were given a protective shield, wear it as told by your health care provider. If you were not given a protective shield, wear sunglasses as told by your health care provider to protect your eyes. °· Keep the area around your eye clean and dry. Avoid swimming or allowing water to hit you directly in the face while showering until told by your health care provider. Keep soap and shampoo out of your eyes. °· Do not put a contact lens  into the affected eye or eyes until your health care provider approves. °· Keep all follow-up visits as told by your health care provider. This is important. °Contact a health care provider if: ° °· You have increased bruising around your eye. °· You have pain that is not helped with medicine. °· You have a fever. °· You have redness, swelling, or pain in your eye. °· You have fluid, blood, or pus coming from your incision. °· Your vision gets worse. °Get help right away if: °· You have sudden vision loss. °This information is not intended to replace advice given to you by your health care provider. Make sure you discuss any questions you have with your health care provider. °Document Released: 04/01/2005 Document Revised: 01/21/2016 Document Reviewed: 07/23/2015 °Elsevier Interactive Patient Education © 2017 [employer] ° ° ° ° °General Anesthesia, Adult, Care After °These instructions provide you with information about caring for yourself after your procedure. Your health care provider may also give you more specific instructions. Your treatment has been planned according to current medical practices, but problems sometimes occur. Call your health care provider if you have any problems or questions after your procedure. °What can I expect after the procedure? °After the procedure, it is common to have: °· Vomiting. °· A sore throat. °· Mental slowness. °It is common to feel: °· Nauseous. °· Cold or shivery. °· Sleepy. °· Tired. °· Sore or achy, even in parts of your body where you did not have surgery. °Follow these instructions at   home: °For at least 24 hours after the procedure:  °· Do not: °¨ Participate in activities where you could fall or become injured. °¨ Drive. °¨ Use heavy machinery. °¨ Drink alcohol. °¨ Take sleeping pills or medicines that cause drowsiness. °¨ Make important decisions or sign legal documents. °¨ Take care of children on your own. °· Rest. °Eating and drinking  °· If you vomit, drink  water, juice, or soup when you can drink without vomiting. °· Drink enough fluid to keep your urine clear or pale yellow. °· Make sure you have little or no nausea before eating solid foods. °· Follow the diet recommended by your health care provider. °General instructions  °· Have a responsible adult stay with you until you are awake and alert. °· Return to your normal activities as told by your health care provider. Ask your health care provider what activities are safe for you. °· Take over-the-counter and prescription medicines only as told by your health care provider. °· If you smoke, do not smoke without supervision. °· Keep all follow-up visits as told by your health care provider. This is important. °Contact a health care provider if: °· You continue to have nausea or vomiting at home, and medicines are not helpful. °· You cannot drink fluids or start eating again. °· You cannot urinate after 8-12 hours. °· You develop a skin rash. °· You have fever. °· You have increasing redness at the site of your procedure. °Get help right away if: °· You have difficulty breathing. °· You have chest pain. °· You have unexpected bleeding. °· You feel that you are having a life-threatening or urgent problem. °This information is not intended to replace advice given to you by your health care provider. Make sure you discuss any questions you have with your health care provider. °Document Released: 12/19/2000 Document Revised: 02/15/2016 Document Reviewed: 08/27/2015 °Elsevier Interactive Patient Education © 2017 [employer]vier Inc[employer] ° °

## 2017-02-14 ENCOUNTER — Ambulatory Visit: Payer: Medicare Other | Admitting: Anesthesiology

## 2017-02-14 ENCOUNTER — Ambulatory Visit
Admission: RE | Admit: 2017-02-14 | Discharge: 2017-02-14 | Disposition: A | Discharge status: Home / Self Care | Payer: Medicare Other | Source: Ambulatory Visit | Attending: Ophthalmology | Admitting: Ophthalmology

## 2017-02-14 ENCOUNTER — Encounter
Admission: RE | Disposition: A | Discharge status: Home / Self Care | Payer: Self-pay | Source: Ambulatory Visit | Attending: Ophthalmology

## 2017-02-14 DIAGNOSIS — Z90710 Acquired absence of both cervix and uterus: Secondary | ICD-10-CM | POA: Diagnosis not present

## 2017-02-14 DIAGNOSIS — G2581 Restless legs syndrome: Secondary | ICD-10-CM | POA: Insufficient documentation

## 2017-02-14 DIAGNOSIS — K5790 Diverticulosis of intestine, part unspecified, without perforation or abscess without bleeding: Secondary | ICD-10-CM | POA: Insufficient documentation

## 2017-02-14 DIAGNOSIS — I10 Essential (primary) hypertension: Secondary | ICD-10-CM | POA: Insufficient documentation

## 2017-02-14 DIAGNOSIS — K219 Gastro-esophageal reflux disease without esophagitis: Secondary | ICD-10-CM | POA: Diagnosis not present

## 2017-02-14 DIAGNOSIS — H2511 Age-related nuclear cataract, right eye: Secondary | ICD-10-CM | POA: Insufficient documentation

## 2017-02-14 HISTORY — PX: CATARACT EXTRACTION W/PHACO: SHX586

## 2017-02-14 SURGERY — PHACOEMULSIFICATION, CATARACT, WITH IOL INSERTION
Anesthesia: Monitor Anesthesia Care | Site: Eye | Laterality: Right | Wound class: Clean

## 2017-02-14 MED ORDER — MIDAZOLAM HCL 2 MG/2ML IJ SOLN
INTRAMUSCULAR | Status: DC | PRN
  Administered 2017-02-14: 2 mg via INTRAVENOUS

## 2017-02-14 MED ORDER — SODIUM HYALURONATE 23 MG/ML IO SOLN
INTRAOCULAR | Status: DC | PRN
  Administered 2017-02-14: 0.6 mL via INTRAOCULAR

## 2017-02-14 MED ORDER — SODIUM HYALURONATE 10 MG/ML IO SOLN
INTRAOCULAR | Status: DC | PRN
  Administered 2017-02-14: 0.55 mL via INTRAOCULAR

## 2017-02-14 MED ORDER — ARMC OPHTHALMIC DILATING DROPS
1.0000 | OPHTHALMIC | Status: DC | PRN
Start: 2017-02-14 — End: 2017-02-14
  Administered 2017-02-14 (×3): 1 via OPHTHALMIC

## 2017-02-14 MED ORDER — MOXIFLOXACIN HCL 0.5 % OP SOLN
OPHTHALMIC | Status: DC | PRN
  Administered 2017-02-14: 0.2 mL via OPHTHALMIC

## 2017-02-14 MED ORDER — FENTANYL CITRATE (PF) 100 MCG/2ML IJ SOLN
INTRAMUSCULAR | Status: DC | PRN
  Administered 2017-02-14: 100 ug via INTRAVENOUS

## 2017-02-14 MED ORDER — LIDOCAINE HCL (PF) 2 % IJ SOLN
INTRAOCULAR | Status: DC | PRN
  Administered 2017-02-14: 1 mL via INTRAOCULAR

## 2017-02-14 MED ORDER — EPINEPHRINE PF 1 MG/ML IJ SOLN
INTRAOCULAR | Status: DC | PRN
  Administered 2017-02-14: 73 mL via OPHTHALMIC

## 2017-02-14 SURGICAL SUPPLY — 16 items
CANNULA ANT/CHMB 27GA (MISCELLANEOUS) ×3 IMPLANT
DISSECTOR HYDRO NUCLEUS 50X22 (MISCELLANEOUS) ×3 IMPLANT
GLOVE BIO SURGEON STRL SZ8 (GLOVE) ×3 IMPLANT
GLOVE SURG LX 7.5 STRW (GLOVE) ×2
GLOVE SURG LX STRL 7.5 STRW (GLOVE) ×1 IMPLANT
GOWN STRL REUS W/ TWL LRG LVL3 (GOWN DISPOSABLE) ×2 IMPLANT
GOWN STRL REUS W/TWL LRG LVL3 (GOWN DISPOSABLE) ×4
MARKER SKIN DUAL TIP RULER LAB (MISCELLANEOUS) ×3 IMPLANT
PACK CATARACT (MISCELLANEOUS) ×3 IMPLANT
PACK DR. KING ARMS (PACKS) ×3 IMPLANT
PACK EYE AFTER SURG (MISCELLANEOUS) ×3 IMPLANT
SYR 3ML LL SCALE MARK (SYRINGE) ×3 IMPLANT
SYR TB 1ML LUER SLIP (SYRINGE) ×3 IMPLANT
WATER STERILE IRR 500ML POUR (IV SOLUTION) ×3 IMPLANT
WIPE NON LINTING 3.25X3.25 (MISCELLANEOUS) ×3 IMPLANT
ZXR00 TECNIS SYMPHONY IOL (Intraocular Lens) ×3 IMPLANT

## 2017-02-14 NOTE — Transfer of Care (Signed)
Immediate Anesthesia Transfer of Care Note  Patient: Shannon Petersen  Procedure(s) Performed: Procedure(s): CATARACT EXTRACTION PHACO AND INTRAOCULAR LENS PLACEMENT (IOC)  symfony lens right (Right)  Patient Location: PACU  Anesthesia Type: MAC  Level of Consciousness: awake, alert  and patient cooperative  Airway and Oxygen Therapy: Patient Spontanous Breathing and Patient connected to supplemental oxygen  Post-op Assessment: Post-op Vital signs reviewed, Patient's Cardiovascular Status Stable, Respiratory Function Stable, Patent Airway and No signs of Nausea or vomiting  Post-op Vital Signs: Reviewed and stable  Complications: No apparent anesthesia complications

## 2017-02-14 NOTE — Anesthesia Procedure Notes (Signed)
Procedure Name: MAC Performed by: Mayme Genta Pre-anesthesia Checklist: Patient identified, Emergency Drugs available, Suction available, Timeout performed and Patient being monitored Patient Re-evaluated:Patient Re-evaluated prior to inductionOxygen Delivery Method: Nasal cannula Placement Confirmation: positive ETCO2

## 2017-02-14 NOTE — Op Note (Signed)
OPERATIVE NOTE  Barbette OrKatie E Hartje [id number] 02/14/2017   PREOPERATIVE DIAGNOSIS:  Nuclear sclerotic cataract right eye.  H25.11   POSTOPERATIVE DIAGNOSIS:    Nuclear sclerotic cataract right eye.     PROCEDURE:  Phacoemusification with posterior chamber intraocular lens placement of the right eye   LENS:   Implant Name Type Inv. Item Serial No. Manufacturer Lot No. LRB No. Used  ZXR00 TECNIS SYMPHONY IOL Intraocular Lens   [id number] [facility]   Right 1       ZXR00 +13.5   ULTRASOUND TIME: 0 minutes 21 seconds.  CDE 3.07   SURGEON:  Willey BladeBradley Sarahbeth Cashin, MD, MPH  ANESTHESIOLOGIST: Anesthesiologist: Harolyn RutherfordFriedman, Joshua, DO CRNA: Jimmy PicketAmyot, Michael, CRNA   ANESTHESIA:  Topical with tetracaine drops augmented with 1% preservative-free intracameral lidocaine.  ESTIMATED BLOOD LOSS: less than 1 mL.   COMPLICATIONS:  None.   DESCRIPTION OF PROCEDURE:  The patient was identified in the holding room and transported to the operating room and placed in the supine position under the operating microscope.  The right eye was identified as the operative eye and it was prepped and draped in the usual sterile ophthalmic fashion.   A 1.0 millimeter clear-corneal paracentesis was made at the 10:30 position. 0.5 ml of preservative-free 1% lidocaine with epinephrine was injected into the anterior chamber.  The anterior chamber was filled with Healon 5 viscoelastic.  A 2.4 millimeter keratome was used to make a near-clear corneal incision at the 8:00 position.  A curvilinear capsulorrhexis was made with a cystotome and capsulorrhexis forceps.  Balanced salt solution was used to hydrodissect and hydrodelineate the nucleus.   Phacoemulsification was then used in stop and chop fashion to remove the lens nucleus and epinucleus.  The remaining cortex was then removed using the irrigation and aspiration handpiece. Healon was then placed into the capsular bag to distend it for lens placement.  A lens was then injected into  the capsular bag.  The remaining viscoelastic was aspirated.   Wounds were hydrated with balanced salt solution.  The anterior chamber was inflated to a physiologic pressure with balanced salt solution.   Intracameral vigamox 0.1 mL undiluted was injected into the eye and a drop placed onto the ocular surface.  No wound leaks were noted.  The patient was taken to the recovery room in stable condition without complications of anesthesia or surgery  Willey BladeBradley Raynaldo Falco 02/14/2017, 11:18 AM

## 2017-02-14 NOTE — Anesthesia Preprocedure Evaluation (Signed)
Anesthesia Evaluation  Patient identified by MRN, date of birth, ID band Patient awake    Airway Mallampati: I  TM Distance: >3 FB Neck ROM: Full    Dental   Pulmonary    Pulmonary exam normal        Cardiovascular hypertension, Pt. on medications Normal cardiovascular exam     Neuro/Psych    GI/Hepatic GERD  Medicated and Controlled,  Endo/Other    Renal/GU      Musculoskeletal   Abdominal   Peds  Hematology   Anesthesia Other Findings   Reproductive/Obstetrics                             Anesthesia Physical  Anesthesia Plan  ASA: II  Anesthesia Plan: MAC   Post-op Pain Management:    Induction: Intravenous  Airway Management Planned:   Additional Equipment:   Intra-op Plan:   Post-operative Plan:   Informed Consent: I have reviewed the patients History and Physical, chart, labs and discussed the procedure including the risks, benefits and alternatives for the proposed anesthesia with the patient or authorized representative who has indicated his/her understanding and acceptance.     Plan Discussed with: CRNA  Anesthesia Plan Comments:         Anesthesia Quick Evaluation

## 2017-02-14 NOTE — Anesthesia Postprocedure Evaluation (Signed)
Anesthesia Post Note  Patient: Shannon Petersen  Procedure(s) Performed: Procedure(s) (LRB): CATARACT EXTRACTION PHACO AND INTRAOCULAR LENS PLACEMENT (IOC)  symfony lens right (Right)  Patient location during evaluation: PACU Anesthesia Type: MAC Level of consciousness: awake and alert and oriented Pain management: pain level controlled Vital Signs Assessment: post-procedure vital signs reviewed and stable Respiratory status: spontaneous breathing and nonlabored ventilation Cardiovascular status: stable Postop Assessment: no signs of nausea or vomiting and adequate PO intake Anesthetic complications: no    Estill Batten

## 2017-02-14 NOTE — H&P (Signed)
The History and Physical notes are on paper, have been signed, and are to be scanned.   I have examined the patient and there are no changes to the H&P.   Willey BladeBradley Juandaniel Manfredo 02/14/2017 10:44 AM

## 2017-02-15 ENCOUNTER — Encounter: Payer: Self-pay | Admitting: Ophthalmology

## 2017-09-28 ENCOUNTER — Ambulatory Visit
Admission: EM | Admit: 2017-09-28 | Discharge: 2017-09-28 | Disposition: A | Discharge status: Home / Self Care | Payer: Medicare Other | Attending: Family Medicine | Admitting: Family Medicine

## 2017-09-28 ENCOUNTER — Encounter: Payer: Self-pay | Admitting: *Deleted

## 2017-09-28 ENCOUNTER — Other Ambulatory Visit: Payer: Self-pay

## 2017-09-28 DIAGNOSIS — M542 Cervicalgia: Secondary | ICD-10-CM | POA: Diagnosis not present

## 2017-09-28 MED ORDER — MELOXICAM 15 MG PO TABS: 15.0000 mg | ORAL_TABLET | Freq: Every day | ORAL | 0 refills | Status: DC | PRN

## 2017-09-28 MED ORDER — CYCLOBENZAPRINE HCL 10 MG PO TABS: 10.0000 mg | ORAL_TABLET | Freq: Three times a day (TID) | ORAL | 0 refills | Status: DC | PRN
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
## 2017-09-28 NOTE — Discharge Instructions (Signed)
Medications as needed.  Rest.  Take care  Dr. Adriana Simasook

## 2017-10-04 ENCOUNTER — Ambulatory Visit
Admission: EM | Admit: 2017-10-04 | Discharge: 2017-10-04 | Disposition: A | Discharge status: Home / Self Care | Payer: Medicare Other | Attending: Family Medicine | Admitting: Family Medicine

## 2017-10-04 ENCOUNTER — Other Ambulatory Visit: Payer: Self-pay

## 2017-10-04 DIAGNOSIS — M940 Chondrocostal junction syndrome [Tietze]: Secondary | ICD-10-CM

## 2017-10-04 NOTE — Discharge Instructions (Signed)
Ibuprofen 600mg  three times daily Ice to area for 20 minutes every 2-3 hours
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
## 2020-08-28 ENCOUNTER — Other Ambulatory Visit: Payer: Self-pay

## 2020-08-28 ENCOUNTER — Ambulatory Visit: Payer: Medicare PPO | Attending: Otolaryngology

## 2020-08-28 DIAGNOSIS — R42 Dizziness and giddiness: Secondary | ICD-10-CM | POA: Diagnosis present

## 2020-08-28 NOTE — Patient Instructions (Signed)
Access Code: [email or web address] URL: [email or web address] Date: 08/28/2020 Prepared by: Ria Comment  Exercises Seated Gaze Stabilization with Head Rotation - 4 x daily - 7 x weekly - 3 reps - 60 seconds hold

## 2020-08-28 NOTE — Therapy (Signed)
[facility] [address]. [place], [place], [address] Phone: [phone]   Fax:  [phone]  Physical Therapy Evaluation  Patient Details  Name: Shannon Petersen MRN: [id number] Date of Birth: 1951/06/22 Referring Provider (PT): Dr. Elenore Rota   Encounter Date: 08/28/2020   PT End of Session - 08/28/20 0758    Visit Number 1    Number of Visits 9    Date for PT Re-Evaluation 10/23/20    Authorization Type eval: 08/28/20    PT Start Time 0800    PT Stop Time 0900    PT Time Calculation (min) 60 min    Activity Tolerance Patient tolerated treatment well    Behavior During Therapy [facility] for tasks assessed/performed           Past Medical History:  Diagnosis Date  . Acid reflux   . Herniated disc, cervical   . Hypertension   . Pericarditis 2012  . PONV (postoperative nausea and vomiting)   . Restless leg   . Wears contact lenses   . Wrist disorder    right - damaged tendon.  limited use    Past Surgical History:  Procedure Laterality Date  . ABDOMINAL HYSTERECTOMY    . CATARACT EXTRACTION W/PHACO Left 01/17/2017   Procedure: CATARACT EXTRACTION PHACO AND INTRAOCULAR LENS PLACEMENT (IOC) left symfony lens;  Surgeon: Nevada Crane, MD;  [facility];  Service: Ophthalmology;  Laterality: Left;  symfony lens  . CATARACT EXTRACTION W/PHACO Right 02/14/2017   Procedure: CATARACT EXTRACTION PHACO AND INTRAOCULAR LENS PLACEMENT (IOC)  symfony lens right;  Surgeon: Nevada Crane, MD;  Location: [facility];  Service: Ophthalmology;  Laterality: Right;  . ELBOW SURGERY Right    topaz  . TUBAL LIGATION    . WRIST SURGERY Right    tendon damage    There were no vitals filed for this visit.    Subjective Assessment - 08/28/20 0756    Subjective Dizziness    Pertinent History History of fall in the end of July 2021 in the setting of dizziness. She reports that her friend witnessed the fall and told her  that she hit her head on a porcelain floor and it bounced off the floor and hit a second time. She sustained a scalp laceration which was repaired with staples at the ED in [facility]. Pt reports that she passed out 3 times on the way to the hospital in the ambulance. She reports that after the fall she had significant nausea and vomiting in the hospital which was treated with medication and improved with time. Head CT was negative at the hospital for ICH. She was found to have a UTI and was treated with antibiotics. She also reports that had very low potassium at the ED which was corrected in the hospital. She denies any repeat CT scans since discharge. She reports that after the fall she started having regular dizziness. Denies any excessive fatigue, nausea/vomiting recently, headaches, photophobia, or phonophobia. She does report increased difficulty with sleep since the injury as well as some difficulty concentrating and increase in emotional lability. During the evaluation pt reports "I might be a little depressed."  She reports that her sister is staying with her currently who is an alcoholic and this is increasing her stress. She denies any chest pain or palpitations. Pt has a history of HTN and pericarditis but otherwise denies any significant heart disease. Denies any presyncopal sensations or symptoms. Denies feeling  sweaty or clammy when episodes of dizziness occur. Pt states that after the fall her blood pressure started dropping so one anti-hypertensive medication was removed from her regimen but otherwise no recent changes in health or medications.    Diagnostic tests See history    Patient Stated Goals Improve dizziness    Currently in Pain? Other (Comment)   Unrelated to current episode             [facility] PT Assessment - 08/28/20 0756      Assessment   Medical Diagnosis Dizziness    Referring Provider (PT) Dr. Elenore RotaJuengel    Onset Date/Surgical Date 04/24/20   Approximate   Hand  Dominance Right    Next MD Visit PCP in April 2022    Prior Therapy None for this issue, has had PT for other issues in the past      Precautions   Precautions None      Restrictions   Weight Bearing Restrictions No      Balance Screen   Has the patient fallen in the past 6 months Yes    How many times? 2    Has the patient had a decrease in activity level because of a fear of falling?  No    Is the patient reluctant to leave their home because of a fear of falling?  No      Home Nurse, mental healthnvironment   Living Environment Private residence    Living Arrangements Other relatives;Other (Comment)   Caring for sister now who is on Hospice   Available Help at Discharge Family    Type of Home House    Home Access Stairs to enter    Entrance Stairs-Number of Steps 4    Entrance Stairs-Rails Right    Home Layout One level    Home Equipment None      Prior Function   Level of Independence Independent      Cognition   Overall Cognitive Status Within Functional Limits for tasks assessed              VESTIBULAR AND BALANCE EVALUATION   HISTORY:  Subjective history of current problem: History of fall in the end of July 2021 in the setting of dizziness. She reports that her friend witnessed the fall and told her that she hit her head on a porcelain floor and it bounced off the floor and hit a second time. She sustained a scalp laceration which was [facility] staples [facility]. Pt reports that she passed out 3 times on the way to the hospital in the ambulance. She reports that after the fall she had significant nausea and vomiting in the hospital which was treated with medication and improved with time. Head CT was negative at the hospital for ICH. She was found to have a UTI and was treated with antibiotics. She also reports that had very low potassium at the ED which was corrected in the hospital. She denies any repeat CT scans since discharge. She reports that after the fall  she started having regular dizziness. Denies any excessive fatigue, nausea/vomiting recently, headaches, photophobia, or phonophobia. She does report increased difficulty with sleep since the injury as well as some difficulty concentrating and increase in emotional lability. During the evaluation pt reports "I might be a little depressed."  She reports that her sister is staying with her currently who is an alcoholic and this is increasing her stress. She denies any chest pain or palpitations.  Pt has a history of HTN and pericarditis but otherwise denies any significant heart disease. Denies any presyncopal sensations or symptoms. Denies feeling sweaty or clammy when episodes of dizziness occur. Pt states that after the fall her blood pressure started dropping so one anti-hypertensive medication was removed from her regimen but otherwise no recent changes in health or medications.   Description of dizziness: vertigo, unsteadiness,  (vertigo, unsteadiness, lightheadedness, falling, general unsteadiness, whoozy, swimmy-headed sensation, aural fullness) Frequency: 3-4x/day Duration: 1 minute Symptom nature: (motion provoked, positional, spontaneous, constant, variable, intermittent) spontaneous but also occasionally motion-provoked  Provocative Factors: bending over Easing Factors: sitting down, She has not tried Meclizine or any other medications  Progression of symptoms: (better, worse, no change since onset) slight improvement History of similar episodes: None  Falls (yes/no): Yes Number of falls in past 6 months: 2  Prior Functional Level: Independent for ADLs/IADLs without assistive device   Auditory complaints (tinnitus, pain, drainage, hearing loss, aural fullness): None Vision (diplopia, visual field loss, recent changes, last eye exam): None Headaches: None Chest pain/palpitations: None  Red Flags: (dysarthria, dysphagia, drop attacks, bowel and bladder changes, recent weight loss/gain)  Review of systems negative for red flags.     EXAMINATION  POSTURE: Grossly WNL  NEUROLOGICAL SCREEN: (2+ unless otherwise noted.) N=normal  Ab=abnormal  Level Dermatome R L Myotome R L Reflex R L  C3 Anterior Neck N N Sidebend C2-3 N N Jaw CN V    C4 Top of Shoulder N N Shoulder Shrug C4 N N Hoffman's UMN    C5 Lateral Upper Arm N N Shoulder ABD C4-5 N N Biceps C5-6    C6 Lateral Arm/ Thumb N N Arm Flex/ Wrist Ext C5-6 N N Brachiorad. C5-6    C7 Middle Finger N N Arm Ext//Wrist Flex C6-7 N N Triceps C7    C8 4th & 5th Finger N N Flex/ Ext Carpi Ulnaris C8 N N Patellar (L3-4)    T1 Medial Arm N N Interossei T1 N N Gastrocnemius    L2 Medial thigh/groin N N Illiopsoas (L2-3) N N     L3 Lower thigh/med.knee N N Quadriceps (L3-4) N N     L4 Medial leg/lat thigh N N Tibialis Ant (L4-5) N N     L5 Lat. leg & dorsal foot N N EHL (L5) N N     S1 post/lat foot/thigh/leg N N Gastrocnemius (S1-2) N N     S2 Post./med. thigh & leg N N Hamstrings (L4-S3) N N       CRANIAL NERVES Visual acuity and visual Kicklighter are intact  Extraocular muscles are intact  Hearing is normal as tested by gross conversation Shoulder shrug strength is intact    COORDINATION: Deferred   MUSCULOSKELETAL SCREEN: ROM:  Cervical Spine ROM: WFL and painless in all planes. No gross deficits identified UE/LE  ROM: WFL and painless. No gross deficits identified  MMT:  Grossly WFL without focal weakness with the exception of weakness in R grip strength which is chronic   FUNCTIONAL MOBILITY: Independent for transfers and ambulation without assistive device    POSTURAL CONTROL TESTS:   Clinical Test of Sensory Interaction for Balance    (CTSIB): Deferred   OCULOMOTOR / VESTIBULAR TESTING:  Oculomotor Exam- Room Light  Findings Comments  Ocular Alignment normal   Ocular ROM normal   Spontaneous Nystagmus normal   Gaze-Holding Nystagmus normal   End-Gaze Nystagmus normal   Vergence (normal 2-3")  abnormal 5"  Smooth Pursuit normal  Cross-Cover Test normal   Saccades abnormal Slow and multiple corrective saccades  VOR Cancellation abnormal Dizziness and blurring reported  Left Head Impulse abnormal Consistent significant corrective saccade  Right Head Impulse normal   Static Acuity not examined   Dynamic Acuity not examined     Oculomotor Exam- Fixation Suppressed: Deferred  BPPV TESTS:  Symptoms Duration Intensity Nystagmus  L Dix-Hallpike None   None  R Dix-Hallpike None   None  L Head Roll None   None  R Head Roll None   None  L Sidelying Test      R Sidelying Test        Orthostatic vitals: Supine: BP: 139/73, HR: 65, SpO2%: 100% Sitting: BP: 139/90 , HR: 62, SpO2%: 99% Standing: BP: 136/83, HR: 71, SpO2%: 99%   FUNCTIONAL OUTCOME MEASURES   Results Comments  BERG Deferred   DGI Deferred   FOTO 76 Predicted improvement to 85  ABC Scale Deferred   DHI Deferred        Objective measurements completed on examination: See above findings.      TREATMENT   Neuromuscular Re-education   VOR x 1 horizontal in sitting and using plain background 60s x 3; Pt provided HEP with education about how to perform correctly.           PT Education - 08/28/20 0758    Education Details Plan of care and HEP    Person(s) Educated Patient    Methods Explanation;Handout    Comprehension Verbalized understanding            PT Short Term Goals - 08/28/20 0806      PT SHORT TERM GOAL #1   Title Pt will be independent with HEP in order to improve strength and balance in order to decrease fall risk and improve function at home and work.    Time 4    Period Weeks    Status New    Target Date 09/25/20             PT Long Term Goals - 08/28/20 0940      PT LONG TERM GOAL #1   Title Pt will improve FOTO to at least 85 to demonstrate significant improvement in function related to her dizziness    Time 8    Period Weeks    Status New    Target Date  10/23/20      PT LONG TERM GOAL #2   Title Pt will decrease DHI score by at least 18 points in order to demonstrate clinically significant reduction in disability    Baseline 08/28/20: To be completed at next session    Time 8    Status New    Target Date 10/23/20      PT LONG TERM GOAL #3   Title Pt will report at least 75% improvement in her dizziness in order to resume full function at home with less symptoms    Time 8    Period Weeks    Status New    Target Date 10/23/20                  Plan - 08/28/20 0758    Clinical Impression Statement Pt is a pleasant 69 year-old female referred for dizziness. Pt was evaluated by ENT and VNG was negative for peripheral findings. PT examination today is consistent with findings indicating central etiology. Pt does report slight improvement in symptoms since they started. Based on patients history it sounds like  she likely sufferred a concussion when she fell and struck her head on the floor suffering her scalp laceration. Pt endorses nausea and vomiting initially followed by persistent dizziness, worsening sleep difficulty, worsening anxiety, and difficulty concentrating. She denies any persistent headaches but does endorse feeling depressed with a PHQ-9 score of 10. MD reports possible referral to neurology and pt states that she is interested in seeing neurology at this time. This seems like a reasonable referral. Will message MD and request he proceed with referral to neurology. She will benefit from vestibular therapy for habituation exercises. Will perform additional balance testing at next session.    Personal Factors and Comorbidities Comorbidity 2;Time since onset of injury/illness/exacerbation;Behavior Pattern    Comorbidities HTN, herniated cervical disc    Examination-Activity Limitations [place]    Examination-Participation Restrictions Yard Work;Community Activity    Stability/Clinical Decision Making Stable/Uncomplicated     Clinical Decision Making Low    Rehab Potential Good    PT Frequency 1x / week    PT Duration 8 weeks    PT Treatment/Interventions ADLs/Self Care Home Management;Aquatic Therapy;Biofeedback;Canalith Repostioning;Cryotherapy;Electrical Stimulation;Iontophoresis 4mg /ml Dexamethasone;Moist Heat;Traction;Ultrasound;DME Instruction;Gait training;Stair training;Functional mobility training;Therapeutic activities;Therapeutic exercise;Balance training;Neuromuscular re-education;Patient/family education;Passive range of motion;Dry needling;Manual techniques;Vestibular;Spinal Manipulations;Joint Manipulations    PT Next Visit Plan Fixations suppression (IR) testing, balance testing, have pt complete ABC and DHI    PT Home Exercise Plan Access Code: [email or web address]           Patient will benefit from skilled therapeutic intervention in order to improve the following deficits and impairments:  Dizziness  Visit Diagnosis: Dizziness and giddiness     Problem List There are no problems to display for this patient.  Legion Discher PT, DPT, GCS  Mahsa Hanser 08/28/2020, 1:50 PM  [facility] [address]. [place], [place], [place] Phone: [phone]   Fax:  [phone]  Name: DELLIE PIASECKI MRN: Barbette Or Date of Birth: 1950/10/13

## 2020-09-04 ENCOUNTER — Ambulatory Visit: Payer: Medicare PPO

## 2020-09-10 NOTE — Patient Instructions (Addendum)
Access Code: [email or web address] URL: [email or web address] Date: 09/11/2020 Prepared by: Ria Comment  Exercises Standing Gaze Stabilization with Head Rotation - 4 x daily - 7 x weekly - 3 reps - 60 seconds hold Half Tandem Stance Balance with Head Rotation - 4 x daily - 7 x weekly - 3x30s with each foot forward hold

## 2020-09-11 ENCOUNTER — Ambulatory Visit: Payer: Medicare PPO

## 2020-09-11 ENCOUNTER — Other Ambulatory Visit: Payer: Self-pay

## 2020-09-11 DIAGNOSIS — R42 Dizziness and giddiness: Secondary | ICD-10-CM | POA: Diagnosis not present

## 2020-09-11 NOTE — Therapy (Signed)
[facility] [address] [place], [place], [address] Phone: [phone]   Fax:  [phone]  Physical Therapy Treatment  Patient Details  Name: Shannon Petersen MRN: [id number] Date of Birth: [birth year] Referring Provider (PT): Dr. Elenore Rota   Encounter Date: 09/11/2020   PT End of Session - 09/11/20 0850    Visit Number 2    Number of Visits 9    Date for PT Re-Evaluation 10/23/20    Authorization Type eval: 08/28/20    PT Start Time 0848    PT Stop Time 0930    PT Time Calculation (min) 42 min    Activity Tolerance Patient tolerated treatment well    Behavior During Therapy [facility] for tasks assessed/performed           Past Medical History:  Diagnosis Date  . Acid reflux   . Herniated disc, cervical   . Hypertension   . Pericarditis 2012  . PONV (postoperative nausea and vomiting)   . Restless leg   . Wears contact lenses   . Wrist disorder    right - damaged tendon.  limited use    Past Surgical History:  Procedure Laterality Date  . ABDOMINAL HYSTERECTOMY    . CATARACT EXTRACTION W/PHACO Left 01/17/2017   Procedure: CATARACT EXTRACTION PHACO AND INTRAOCULAR LENS PLACEMENT (IOC) left symfony lens;  Surgeon: Nevada Crane, MD;  Location: [facility];  Service: Ophthalmology;  Laterality: Left;  symfony lens  . CATARACT EXTRACTION W/PHACO Right 02/14/2017   Procedure: CATARACT EXTRACTION PHACO AND INTRAOCULAR LENS PLACEMENT (IOC)  symfony lens right;  Surgeon: Nevada Crane, MD;  Location: [facility];  Service: Ophthalmology;  Laterality: Right;  . ELBOW SURGERY Right    topaz  . TUBAL LIGATION    . WRIST SURGERY Right    tendon damage    There were no vitals filed for this visit.   Subjective Assessment - 09/11/20 0849    Subjective Pt reports that her dizziness is improving. She only had a couple episodes of dizziness when doing her exercises but otherwise no dizziness since the initial  evaluation. No pain reported upon arrival today. Pt reports that she fell once since the last therapy session when a cat got tangled in her legs. She has an appointment with neurology on 09/15/20 and thoughout about cancelling as her symptoms are improving but decided she would like to follow through with the appointment. No specific questions or concerns.    Pertinent History History of fall in the end of July 2021 in the setting of dizziness. She reports that her friend witnessed the fall and told her that she hit her head on a porcelain floor and it bounced off the floor and hit a second time. She sustained a scalp laceration which was repaired with staples at the ED in [facility]. Pt reports that she passed out 3 times on the way to the hospital in the ambulance. She reports that after the fall she had significant nausea and vomiting in the hospital which was treated with medication and improved with time. Head CT was negative at the hospital for ICH. She was found to have a UTI and was treated with antibiotics. She also reports that had very low potassium at the ED which was corrected in the hospital. She denies any repeat CT scans since discharge. She reports that after the fall she started having regular dizziness. Denies any excessive fatigue, nausea/vomiting recently, headaches, photophobia, or phonophobia.  She does report increased difficulty with sleep since the injury as well as some difficulty concentrating and increase in emotional lability. During the evaluation pt reports "I might be a little depressed."  She reports that her sister is staying with her currently who is an alcoholic and this is increasing her stress. She denies any chest pain or palpitations. Pt has a history of HTN and pericarditis but otherwise denies any significant heart disease. Denies any presyncopal sensations or symptoms. Denies feeling sweaty or clammy when episodes of dizziness occur. Pt states that after the fall her  blood pressure started dropping so one anti-hypertensive medication was removed from her regimen but otherwise no recent changes in health or medications.    Diagnostic tests See history    Patient Stated Goals Improve dizziness    Currently in Pain? No/denies            [facility] PT Assessment - 09/11/20 0918      Standardized Balance Assessment   Standardized Balance Assessment Berg Balance Test;Dynamic Gait Index      Berg Balance Test   Sit to Stand Able to stand without using hands and stabilize independently    Standing Unsupported Able to stand safely 2 minutes    Sitting with Back Unsupported but Feet Supported on Floor or Stool Able to sit safely and securely 2 minutes    Stand to Sit Sits safely with minimal use of hands    Transfers Able to transfer safely, minor use of hands    Standing Unsupported with Eyes Closed Able to stand 10 seconds safely    Standing Unsupported with Feet Together Able to place feet together independently and stand 1 minute safely    From Standing, Reach Forward with Outstretched Arm Can reach confidently >25 cm (10")    From Standing Position, Pick up Object from Floor Able to pick up shoe safely and easily    From Standing Position, Turn to Look Behind Over each Shoulder Looks behind from both sides and weight shifts well    Turn 360 Degrees Able to turn 360 degrees safely in 4 seconds or less    Standing Unsupported, Alternately Place Feet on Step/Stool Able to stand independently and safely and complete 8 steps in 20 seconds    Standing Unsupported, One Foot in Front Able to place foot tandem independently and hold 30 seconds    Standing on One Leg Able to lift leg independently and hold > 10 seconds    Total Score 56      Dynamic Gait Index   Level Surface Normal    Change in Gait Speed Normal    Gait with Horizontal Head Turns Moderate Impairment    Gait with Vertical Head Turns Moderate Impairment    Gait and Pivot Turn Normal    Step Over  Obstacle Normal    Step Around Obstacles Normal    Steps Mild Impairment    Total Score 19            TREATMENT   Neuromuscular Re-education  Performed fixation suppression testing and mCTSIB (see results below);   Oculomotor Exam- Fixation Suppressed  Findings Comments  Ocular Alignment normal   Spontaneous Nystagmus normal   Gaze-Holding Nystagmus normal   End-Gaze Nystagmus normal   Head Shaking Nystagmus normal   Pressure-Induced Nystagmus not examined   Hyperventilation Induced Nystagmus not examined   Skull Vibration Induced Nystagmus not examined      Clinical Test of Sensory Interaction for Balance    (  CTSIB):  CONDITION TIME STRATEGY SWAY  Eyes open, firm surface 30 seconds ankle 1+  Eyes closed, firm surface 30 seconds ankle 2+  Eyes open, foam surface 30 seconds ankle 2+  Eyes closed, foam surface 30 seconds ankle 4+    Performed BERG and DGI with patient (see below);   FUNCTIONAL OUTCOME MEASURES    08/28/20 09/11/20 Comments  BERG Deferred 56/56 WNL  DGI Deferred 19/24  Fall risk  FOTO 76 Deferred Predicted improvement to 85  ABC Scale Deferred 56.875% Low balance confidence  DHI Deferred 38/100 Moderate perception of handicap related to dizziness    VOR x 1 horizontal in standing NBOS 60s x 3 (6/10 dizziness after first rep, 8/10 after second rep and pt encouraged to slow down, 3/10 final repetition); Semitandem balance with horizontal head turns x 30s with each foot forward; Updated HEP provided to patient with education about how to complete;   Pt educated throughout session about proper posture and technique with exercises. Improved exercise technique, movement at target joints, use of target muscles after min to mod verbal, visual, tactile cues.    Pt demonstrates excellent motivation during session. Fixation suppression testing with IR goggles is negative. She demonstrates 30s of balance in all conditions of the mCTSIB however 3+ saw  on foam with eyes closed (condition 4). ABC of 56.875% indicates low balance confidence and DHI of 38/100 indicates moderate perception of handicap related to dizziness. BERG is 56/56 indicating good static balance confidence however DGI of 19/24 demonstrates impaired dynamic balance especially with horizontal and vertical head turns. Pt provided progression of HEP and encouraged to follow-up as scheduled. Pt will benefit from PT services to address deficits in balance and dizziness in order to return to full function at home.                      PT Short Term Goals - 08/28/20 0806      PT SHORT TERM GOAL #1   Title Pt will be independent with HEP in order to improve strength and balance in order to decrease fall risk and improve function at home and work.    Time 4    Period Weeks    Status New    Target Date 09/25/20             PT Long Term Goals - 09/11/20 0920      PT LONG TERM GOAL #1   Title Pt will improve FOTO to at least 85 to demonstrate significant improvement in function related to her dizziness    Baseline 08/28/20: 76    Time 8    Period Weeks    Status New    Target Date 10/23/20      PT LONG TERM GOAL #2   Title Pt will decrease DHI score by at least 18 points in order to demonstrate clinically significant reduction in disability    Baseline 08/28/20: To be completed at next session; 09/11/20: 38/100    Time 8    Status New    Target Date 10/23/20      PT LONG TERM GOAL #3   Title Pt will report at least 75% improvement in her dizziness in order to resume full function at home with less symptoms    Time 8    Period Weeks    Status New    Target Date 10/23/20      PT LONG TERM GOAL #4   Title Pt will improve  ABC by at least 13% in order to demonstrate clinically significant improvement in balance confidence.    Baseline 09/11/20: 56.875%    Time 8    Period Weeks    Status New    Target Date 10/23/20                 Plan -  09/11/20 0851    Clinical Impression Statement Pt demonstrates excellent motivation during session. Fixation suppression testing with IR goggles is negative. She demonstrates 30s of balance in all conditions of the mCTSIB however 3+ saw on foam with eyes closed (condition 4). ABC of 56.875% indicates low balance confidence and DHI of 38/100 indicates moderate perception of handicap related to dizziness. BERG is 56/56 indicating good static balance confidence however DGI of 19/24 demonstrates impaired dynamic balance especially with horizontal and vertical head turns. Pt provided progression of HEP and encouraged to follow-up as scheduled. Pt will benefit from PT services to address deficits in balance and dizziness in order to return to full function at home    Personal Factors and Comorbidities Comorbidity 2;Time since onset of injury/illness/exacerbation;Behavior Pattern    Comorbidities HTN, herniated cervical disc    Examination-Activity Limitations [place]    Examination-Participation Restrictions Yard Work;Community Activity    Stability/Clinical Decision Making Stable/Uncomplicated    Rehab Potential Good    PT Frequency 1x / week    PT Duration 8 weeks    PT Treatment/Interventions ADLs/Self Care Home Management;Aquatic Therapy;Biofeedback;Canalith Repostioning;Cryotherapy;Electrical Stimulation;Iontophoresis 4mg /ml Dexamethasone;Moist Heat;Traction;Ultrasound;DME Instruction;Gait training;Stair training;Functional mobility training;Therapeutic activities;Therapeutic exercise;Balance training;Neuromuscular re-education;Patient/family education;Passive range of motion;Dry needling;Manual techniques;Vestibular;Spinal Manipulations;Joint Manipulations    PT Next Visit Plan Progress adaptation and habituation exercises, balance exercises;    PT Home Exercise Plan Access Code: [email or web address]           Patient will benefit from skilled therapeutic intervention in order to improve the following deficits  and impairments:  Dizziness  Visit Diagnosis: Dizziness and giddiness     Problem List There are no problems to display for this patient.  Papa Piercefield PT, DPT, GCS  Keishon Chavarin 09/11/2020, 3:41 PM  [place] [address]. [place], [place], [place] Phone: [phone]   Fax:  [phone]  Name: Shannon Petersen MRN: Barbette Or Date of Birth: 06/05/1951

## 2020-09-16 ENCOUNTER — Other Ambulatory Visit: Payer: Self-pay | Admitting: Neurology

## 2020-09-16 DIAGNOSIS — I639 Cerebral infarction, unspecified: Secondary | ICD-10-CM

## 2020-09-17 ENCOUNTER — Ambulatory Visit: Payer: Medicare PPO

## 2020-09-17 ENCOUNTER — Other Ambulatory Visit: Payer: Self-pay

## 2020-09-17 DIAGNOSIS — R42 Dizziness and giddiness: Secondary | ICD-10-CM

## 2020-09-17 NOTE — Patient Instructions (Addendum)
Access Code: [email or web address] URL: [email or web address] Date: 09/17/2020 Prepared by: Ria Comment  Exercises Standing Gaze Stabilization with Head Rotation - 4 x daily - 7 x weekly - 3 reps - 60 seconds hold Tandem Stance with Head Rotation - 4 x daily - 7 x weekly - 3 x 30s with each foot forward hold Single Leg Stance - 4 x daily - 7 x weekly - 2 x 30s on each leg hold

## 2020-09-17 NOTE — Therapy (Signed)
[facility] [address]. [place], [place], [address] Phone: [phone]   Fax:  [phone]  Physical Therapy Treatment  Patient Details  Name: Shannon Petersen MRN: [id number] Date of Birth: 09-14-1951 Referring Provider (PT): Dr. Elenore Rota   Encounter Date: 09/17/2020   PT End of Session - 09/17/20 0757    Visit Number 3    Number of Visits 9    Date for PT Re-Evaluation 10/23/20    Authorization Type eval: 08/28/20    PT Start Time 0800    PT Stop Time 0845    PT Time Calculation (min) 45 min    Activity Tolerance Patient tolerated treatment well    Behavior During Therapy [facility] for tasks assessed/performed           Past Medical History:  Diagnosis Date  . Acid reflux   . Herniated disc, cervical   . Hypertension   . Pericarditis 2012  . PONV (postoperative nausea and vomiting)   . Restless leg   . Wears contact lenses   . Wrist disorder    right - damaged tendon.  limited use    Past Surgical History:  Procedure Laterality Date  . ABDOMINAL HYSTERECTOMY    . CATARACT EXTRACTION W/PHACO Left 01/17/2017   Procedure: CATARACT EXTRACTION PHACO AND INTRAOCULAR LENS PLACEMENT (IOC) left symfony lens;  Surgeon: Nevada Crane, MD;  Location: [facility];  Service: Ophthalmology;  Laterality: Left;  symfony lens  . CATARACT EXTRACTION W/PHACO Right 02/14/2017   Procedure: CATARACT EXTRACTION PHACO AND INTRAOCULAR LENS PLACEMENT (IOC)  symfony lens right;  Surgeon: Nevada Crane, MD;  Location: [facility];  Service: Ophthalmology;  Laterality: Right;  . ELBOW SURGERY Right    topaz  . TUBAL LIGATION    . WRIST SURGERY Right    tendon damage    There were no vitals filed for this visit.   Subjective Assessment - 09/17/20 0756    Subjective Patient reports she is doing well today.  She has had some intermittent dizziness since the last therapy session especially when working in her cabinets and  bending over.  Continues to deny any true vertigo. She had an appointment with the neurologist who has ordered an MRI of her brain to rule out any concern for stroke.  No specific questions or concerns.    Pertinent History History of fall in the end of July 2021 in the setting of dizziness. She reports that her friend witnessed the fall and told her that she hit her head on a porcelain floor and it bounced off the floor and hit a second time. She sustained a scalp laceration which was repaired with staples at the ED in [facility]. Pt reports that she passed out 3 times on the way to the hospital in the ambulance. She reports that after the fall she had significant nausea and vomiting in the hospital which was treated with medication and improved with time. Head CT was negative at the hospital for ICH. She was found to have a UTI and was treated with antibiotics. She also reports that had very low potassium at the ED which was corrected in the hospital. She denies any repeat CT scans since discharge. She reports that after the fall she started having regular dizziness. Denies any excessive fatigue, nausea/vomiting recently, headaches, photophobia, or phonophobia. She does report increased difficulty with sleep since the injury as well as some difficulty concentrating and increase in emotional lability. During  the evaluation pt reports "I might be a little depressed."  She reports that her sister is staying with her currently who is an alcoholic and this is increasing her stress. She denies any chest pain or palpitations. Pt has a history of HTN and pericarditis but otherwise denies any significant heart disease. Denies any presyncopal sensations or symptoms. Denies feeling sweaty or clammy when episodes of dizziness occur. Pt states that after the fall her blood pressure started dropping so one anti-hypertensive medication was removed from her regimen but otherwise no recent changes in health or medications.     Diagnostic tests See history    Patient Stated Goals Improve dizziness    Currently in Pain? No/denies   Pt does have interimttent L low back and L hip pain with certain movements            TREATMENT   Neuromuscular Re-education  NuStep L2 x 5 minutes for warm-up during history (3 minutes unbilled); VOR x 1 horizontal in standing NBOS 60s x 3 (5/10 dizziness after first rep, 3/10 after second rep, and no dizziness after final repetition); Semitandem balance with horizontal head turns x 30s with each foot forward; Airex NBOS eyes open/closed x 30s each; Airex NBOS horizontal and vertical head turns x 30s each; Airex alternating 6" step taps without UE support x 10 on each side; Airex semitandem balance with front foot on 6" step and rearfoot on Airex pad alternating LE x 30s on each side; Gait in hallway with head turns on command to read letters/numbers on the wall 68' x 2; Gait in hallway with horizontal ball tosses to therapist with head/eye follow 7' x 2 toward each side, no dizziness; Gait in hallway with vertical ball toss to self with head/eye follow 68' x 3, pt reports increase in dizziness; Tandem gait in parallel bars without upper extremity support 8 feet x 6 lengths; Airex tandem gait in parallel bars without upper extremity support 6 feet x 6 lengths; Airex sidestepping in parallel bars without extremity support 6 feet x 6 lengths; Airex sidestepping in parallel bars without extremity support alternating horizontal head turns 6 feet x 6 lengths; Added single-leg balance HEP;    Pt educated throughout session about proper posture and technique with exercises. Improved exercise technique, movement at target joints, use of target muscles after min to mod verbal, visual, tactile cues.    Pt demonstrates excellent motivation during session.  She reports increase in dizziness during VOR exercises however gradually improves with repetition.  No dizziness reported with  horizontal head turns during gait in hallway however she does report dizziness during vertical ball toss with head/eye follow.  We will continue to progress head turning activities especially in the vertical direction at follow-up sessions.  Pt provided progression of HEP and encouraged to follow-up as scheduled. Pt will benefit from PT services to address deficits in balance and dizziness in order to return to full function at home.                            PT Short Term Goals - 08/28/20 0806      PT SHORT TERM GOAL #1   Title Pt will be independent with HEP in order to improve strength and balance in order to decrease fall risk and improve function at home and work.    Time 4    Period Weeks    Status New    Target Date 09/25/20  PT Long Term Goals - 09/11/20 0920      PT LONG TERM GOAL #1   Title Pt will improve FOTO to at least 85 to demonstrate significant improvement in function related to her dizziness    Baseline 08/28/20: 76    Time 8    Period Weeks    Status New    Target Date 10/23/20      PT LONG TERM GOAL #2   Title Pt will decrease DHI score by at least 18 points in order to demonstrate clinically significant reduction in disability    Baseline 08/28/20: To be completed at next session; 09/11/20: 38/100    Time 8    Status New    Target Date 10/23/20      PT LONG TERM GOAL #3   Title Pt will report at least 75% improvement in her dizziness in order to resume full function at home with less symptoms    Time 8    Period Weeks    Status New    Target Date 10/23/20      PT LONG TERM GOAL #4   Title Pt will improve ABC by at least 13% in order to demonstrate clinically significant improvement in balance confidence.    Baseline 09/11/20: 56.875%    Time 8    Period Weeks    Status New    Target Date 10/23/20                 Plan - 09/17/20 0757    Clinical Impression Statement Pt demonstrates excellent motivation  during session.  She reports increase in dizziness during VOR exercises however gradually improves with repetition.  No dizziness reported with horizontal head turns during gait in hallway however she does report dizziness during vertical ball toss with head/eye follow.  We will continue to progress head turning activities especially in the vertical direction at follow-up sessions.  Pt provided progression of HEP and encouraged to follow-up as scheduled. Pt will benefit from PT services to address deficits in balance and dizziness in order to return to full function at home.    Personal Factors and Comorbidities Comorbidity 2;Time since onset of injury/illness/exacerbation;Behavior Pattern    Comorbidities HTN, herniated cervical disc    Examination-Activity Limitations [place]    Examination-Participation Restrictions Yard Work;Community Activity    Stability/Clinical Decision Making Stable/Uncomplicated    Rehab Potential Good    PT Frequency 1x / week    PT Duration 8 weeks    PT Treatment/Interventions ADLs/Self Care Home Management;Aquatic Therapy;Biofeedback;Canalith Repostioning;Cryotherapy;Electrical Stimulation;Iontophoresis 4mg /ml Dexamethasone;Moist Heat;Traction;Ultrasound;DME Instruction;Gait training;Stair training;Functional mobility training;Therapeutic activities;Therapeutic exercise;Balance training;Neuromuscular re-education;Patient/family education;Passive range of motion;Dry needling;Manual techniques;Vestibular;Spinal Manipulations;Joint Manipulations    PT Next Visit Plan Progress adaptation and habituation exercises, balance exercises;    PT Home Exercise Plan Access Code: [email or web address]           Patient will benefit from skilled therapeutic intervention in order to improve the following deficits and impairments:  Dizziness  Visit Diagnosis: Dizziness and giddiness     Problem List There are no problems to display for this patient.  Vella Colquitt PT, DPT, GCS   Patsey Pitstick 09/17/2020, 9:07 AM  [place] [facility] [address] [place], [place], [place] Phone: [phone]   Fax:  [phone]  Name: Shannon Petersen MRN: Barbette Or Date of Birth: 1951-01-14

## 2020-09-24 ENCOUNTER — Ambulatory Visit: Payer: Medicare PPO

## 2020-09-24 ENCOUNTER — Ambulatory Visit
Admission: RE | Admit: 2020-09-24 | Discharge: 2020-09-24 | Disposition: A | Discharge status: Home / Self Care | Payer: Medicare PPO | Source: Ambulatory Visit | Attending: Neurology | Admitting: Neurology

## 2020-09-24 ENCOUNTER — Other Ambulatory Visit: Payer: Self-pay

## 2020-09-24 DIAGNOSIS — I639 Cerebral infarction, unspecified: Secondary | ICD-10-CM | POA: Insufficient documentation

## 2020-10-02 ENCOUNTER — Ambulatory Visit: Payer: Medicare PPO | Attending: Otolaryngology

## 2020-10-02 ENCOUNTER — Other Ambulatory Visit: Payer: Self-pay

## 2020-10-02 DIAGNOSIS — R42 Dizziness and giddiness: Secondary | ICD-10-CM | POA: Diagnosis present

## 2020-10-02 NOTE — Patient Instructions (Addendum)
Access Code: [email or web address] URL: [email or web address] Date: 10/02/2020 Prepared by: Ria Comment  Exercises Standing Gaze Stabilization with Head Rotation - 4 x daily - 7 x weekly - 3 reps - 60 seconds hold Tandem Stance with Head Rotation - 1 x daily - 7 x weekly - 3 x 30s with each foot forward hold Single Leg Stance - 1 x daily - 7 x weekly - 3 x 30s on each leg hold Walking Tandem Stance - 1 x daily - 7 x weekly - 3 x 30s hold

## 2020-10-02 NOTE — Therapy (Signed)
[facility] [address] [place], [place], [address] Phone: [phone]   Fax:  [phone]  Physical Therapy Treatment/Discharge  Patient Details  Name: Shannon Petersen MRN: [id number] Date of Birth: May 12, 1951 Referring Provider (PT): Dr. Kathyrn Sheriff   Encounter Date: 10/02/2020   PT End of Session - 10/02/20 0926    Visit Number 4    Number of Visits 9    Date for PT Re-Evaluation 10/23/20    Authorization Type eval: 08/28/20    PT Start Time 0801    PT Stop Time 0843    PT Time Calculation (min) 42 min    Equipment Utilized During Treatment Gait belt    Activity Tolerance Patient tolerated treatment well    Behavior During Therapy [facility] for tasks assessed/performed           Past Medical History:  Diagnosis Date  . Acid reflux   . Herniated disc, cervical   . Hypertension   . Pericarditis 2012  . PONV (postoperative nausea and vomiting)   . Restless leg   . Wears contact lenses   . Wrist disorder    right - damaged tendon.  limited use    Past Surgical History:  Procedure Laterality Date  . ABDOMINAL HYSTERECTOMY    . CATARACT EXTRACTION W/PHACO Left 01/17/2017   Procedure: CATARACT EXTRACTION PHACO AND INTRAOCULAR LENS PLACEMENT ([place]) left symfony lens;  Surgeon: Eulogio Bear, MD;  Location: [facility];  Service: Ophthalmology;  Laterality: Left;  symfony lens  . CATARACT EXTRACTION W/PHACO Right 02/14/2017   Procedure: CATARACT EXTRACTION PHACO AND INTRAOCULAR LENS PLACEMENT ([place])  symfony lens right;  Surgeon: Eulogio Bear, MD;  Location: [facility];  Service: Ophthalmology;  Laterality: Right;  . ELBOW SURGERY Right    topaz  . TUBAL LIGATION    . WRIST SURGERY Right    tendon damage    There were no vitals filed for this visit.   Subjective Assessment - 10/02/20 0803    Subjective Patient reports she is doing well today.  She has not had any additional episodes of dizziness since her  last therapy session.  She reports has been very active at home and while on vacation.  She was able to walk on the beach without any issues with her balance.  She reports she has been working with her horses and has not experienced any symptoms.  She had an MRI of her brain and states that it was normal. No specific questions or concerns.    Pertinent History History of fall in the end of July 2021 in the setting of dizziness. She reports that her friend witnessed the fall and told her that she hit her head on a porcelain floor and it bounced off the floor and hit a second time. She sustained a scalp laceration which was repaired with staples at the ED in [facility]. Pt reports that she passed out 3 times on the way to the hospital in the ambulance. She reports that after the fall she had significant nausea and vomiting in the hospital which was treated with medication and improved with time. Head CT was negative at the hospital for [place]. She was found to have a UTI and was treated with antibiotics. She also reports that had very low potassium at the ED which was corrected in the hospital. She denies any repeat CT scans since discharge. She reports that after the fall she started having regular dizziness. Denies  any excessive fatigue, nausea/vomiting recently, headaches, photophobia, or phonophobia. She does report increased difficulty with sleep since the injury as well as some difficulty concentrating and increase in emotional lability. During the evaluation pt reports "I might be a little depressed."  She reports that her sister is staying with her currently who is an alcoholic and this is increasing her stress. She denies any chest pain or palpitations. Pt has a history of HTN and pericarditis but otherwise denies any significant heart disease. Denies any presyncopal sensations or symptoms. Denies feeling sweaty or clammy when episodes of dizziness occur. Pt states that after the fall her blood pressure  started dropping so one anti-hypertensive medication was removed from her regimen but otherwise no recent changes in health or medications.    Diagnostic tests See history    Patient Stated Goals Improve dizziness    Currently in Pain? No/denies              [facility] PT Assessment - 10/02/20 0831      Dynamic Gait Index   Level Surface Normal    Change in Gait Speed Normal    Gait with Horizontal Head Turns Moderate Impairment    Gait with Vertical Head Turns Normal    Gait and Pivot Turn Normal    Step Over Obstacle Normal    Step Around Obstacles Normal    Steps Mild Impairment    Total Score 21             TREATMENT   Neuromuscular Re-education  Updated outcome measures and goals with patient (see below);   FUNCTIONAL OUTCOME MEASURES   08/28/20 09/11/20 10/02/20 Comments  BERG Deferred 56/56 Deferred WNL  DGI Deferred 19/24  21/24  Fall risk  FOTO 76 Deferred 81 Predicted improvement to 85  ABC Scale Deferred 56.875% 83.75% WNL  DHI Deferred 38/100 14/100 Mild perception of handicap related to dizziness   mCTSIB: 30s in all conditions VOR x 1 horizontal in standing NBOS 60s x 3, pt denies any dizziness; Tandem gait 10' x 4; Tandem balance with horizontal head turns x 30s; Reviewed updated HEP and discharge;   Pt educated throughout session about proper posture and technique with exercises. Improved exercise technique, movement at target joints, use of target muscles after min to mod verbal, visual, tactile cues.   Pt demonstrates excellent motivation during session. She has not had any additional episodes of dizziness since her last therapy session.  She reports has been very active at home and while on vacation.  She was able to walk on the beach without any issues with her balance.  She has been working with her horses and has not experienced any symptoms.  Overall she reports 90% improvement since starting therapy.  She had an MRI of her brain as  ordered by neurology which was negative for any significant findings. Updated outcome measures with patient today.  FOTO improved to 81.  Her ABC improved from 57% at initial evaluation to 84% today.  Her [place] dropped from 38/100 at initial evaluation to 14/100 today.  Improved balance noted on the DGI when performing vertical head turns however she continues to have some staggering with horizontal head turns.  Unable to elicit any dizziness symptoms during session today.  Patient will be discharged today with updated HEP and instructions to follow-up if symptoms.                        PT Education - 10/02/20  0926    Education Details Updated HEP and discharge    Person(s) Educated Patient    Methods Explanation    Comprehension Verbalized understanding            PT Short Term Goals - 10/02/20 0804      PT SHORT TERM GOAL #1   Title Pt will be independent with HEP in order to improve strength and balance in order to decrease fall risk and improve function at home and work.    Time 4    Period Weeks    Status Achieved    Target Date --             PT Long Term Goals - 10/02/20 0804      PT LONG TERM GOAL #1   Title Pt will improve FOTO to at least 85 to demonstrate significant improvement in function related to her dizziness    Baseline 08/28/20: 76; 10/02/20: 81    Time 8    Period Weeks    Status Partially Met    Target Date --      PT LONG TERM GOAL #2   Title Pt will decrease DHI score by at least 18 points in order to demonstrate clinically significant reduction in disability    Baseline 08/28/20: To be completed at next session; 09/11/20: 38/100; 10/02/20: 14/100    Time 8    Period Weeks    Status Achieved      PT LONG TERM GOAL #3   Title Pt will report at least 75% improvement in her dizziness in order to resume full function at home with less symptoms    Baseline 10/02/20: 90% improvement    Time 8    Period Weeks    Status Achieved      PT  LONG TERM GOAL #4   Title Pt will improve ABC by at least 13% in order to demonstrate clinically significant improvement in balance confidence.    Baseline 09/11/20: 56.875%; 10/02/20: 83.75%    Time 8    Period Weeks    Status Achieved                 Plan - 10/02/20 8366    Clinical Impression Statement Pt demonstrates excellent motivation during session. She has not had any additional episodes of dizziness since her last therapy session.  She reports has been very active at home and while on vacation.  She was able to walk on the beach without any issues with her balance.  She has been working with her horses and has not experienced any symptoms.  Overall she reports 90% improvement since starting therapy.  She had an MRI of her brain as ordered by neurology which was negative for any significant findings. Updated outcome measures with patient today.  FOTO improved to 81.  Her ABC improved from 57% at initial evaluation to 84% today.  Her [place] dropped from 38/100 at initial evaluation to 14/100 today.  Improved balance noted on the DGI when performing vertical head turns however she continues to have some staggering with horizontal head turns.  Unable to elicit any dizziness symptoms during session today.  Patient will be discharged today with updated HEP and instructions to follow-up if symptoms.    Personal Factors and Comorbidities Comorbidity 2;Time since onset of injury/illness/exacerbation;Behavior Pattern    Comorbidities HTN, herniated cervical disc    Examination-Activity Limitations [place]    Examination-Participation Restrictions Yard Work;Community Activity    Stability/Clinical Decision Making Stable/Uncomplicated  Rehab Potential Good    PT Frequency 1x / week    PT Duration 8 weeks    PT Treatment/Interventions ADLs/Self Care Home Management;Aquatic Therapy;Biofeedback;Canalith Repostioning;Cryotherapy;Electrical Stimulation;Iontophoresis 22m/ml Dexamethasone;Moist  Heat;Traction;Ultrasound;DME Instruction;Gait training;Stair training;Functional mobility training;Therapeutic activities;Therapeutic exercise;Balance training;Neuromuscular re-education;Patient/family education;Passive range of motion;Dry needling;Manual techniques;Vestibular;Spinal Manipulations;Joint Manipulations    PT Next Visit Plan Progress adaptation and habituation exercises, balance exercises;    PT Home Exercise Plan Access Code: [email or web address]           Patient will benefit from skilled therapeutic intervention in order to improve the following deficits and impairments:  Dizziness  Visit Diagnosis: Dizziness and giddiness     Problem List There are no problems to display for this patient.  JLyndel SafeHuprich PT, DPT, GCS  Huprich,Jason 10/02/2020, 9:34 AM  [place] [facility] [place] [address]Phone: [phone]  Fax:  [phone] Name: Shannon KAMENMRN: [id number] of Birth: [birth year]

## 2020-10-09 ENCOUNTER — Ambulatory Visit: Payer: Medicare PPO

## 2020-10-16 ENCOUNTER — Ambulatory Visit: Payer: Medicare PPO

## 2020-10-23 ENCOUNTER — Ambulatory Visit: Payer: Medicare PPO

## 2021-08-24 ENCOUNTER — Other Ambulatory Visit: Payer: Self-pay | Admitting: Specialist

## 2021-08-24 DIAGNOSIS — I10 Essential (primary) hypertension: Secondary | ICD-10-CM

## 2021-08-24 DIAGNOSIS — Z823 Family history of stroke: Secondary | ICD-10-CM

## 2021-09-03 ENCOUNTER — Other Ambulatory Visit: Payer: Self-pay

## 2021-09-03 ENCOUNTER — Ambulatory Visit
Admission: RE | Admit: 2021-09-03 | Discharge: 2021-09-03 | Disposition: A | Discharge status: Home / Self Care | Payer: Medicare PPO | Source: Ambulatory Visit | Attending: Specialist | Admitting: Specialist

## 2021-09-03 DIAGNOSIS — Z823 Family history of stroke: Secondary | ICD-10-CM | POA: Insufficient documentation

## 2021-09-03 DIAGNOSIS — I10 Essential (primary) hypertension: Secondary | ICD-10-CM | POA: Diagnosis present

## 2021-10-27 ENCOUNTER — Other Ambulatory Visit: Payer: Self-pay | Admitting: Nurse Practitioner

## 2021-10-27 DIAGNOSIS — K219 Gastro-esophageal reflux disease without esophagitis: Secondary | ICD-10-CM

## 2021-11-01 ENCOUNTER — Ambulatory Visit
Admission: RE | Admit: 2021-11-01 | Discharge: 2021-11-01 | Disposition: A | Discharge status: Home / Self Care | Payer: Medicare PPO | Source: Ambulatory Visit | Attending: Nurse Practitioner | Admitting: Nurse Practitioner

## 2021-11-01 ENCOUNTER — Other Ambulatory Visit: Payer: Self-pay

## 2021-11-01 DIAGNOSIS — K219 Gastro-esophageal reflux disease without esophagitis: Secondary | ICD-10-CM | POA: Insufficient documentation

## 2021-12-27 ENCOUNTER — Encounter: Payer: Self-pay | Admitting: *Deleted

## 2021-12-28 ENCOUNTER — Other Ambulatory Visit: Payer: Self-pay

## 2021-12-28 ENCOUNTER — Encounter: Payer: Self-pay | Admitting: *Deleted

## 2021-12-28 ENCOUNTER — Ambulatory Visit
Admission: RE | Admit: 2021-12-28 | Discharge: 2021-12-28 | Disposition: A | Discharge status: Home / Self Care | Payer: Medicare PPO | Attending: Gastroenterology | Admitting: Gastroenterology

## 2021-12-28 ENCOUNTER — Encounter
Admission: RE | Disposition: A | Discharge status: Home / Self Care | Payer: Self-pay | Source: Home / Self Care | Attending: Gastroenterology

## 2021-12-28 ENCOUNTER — Ambulatory Visit: Payer: Medicare PPO | Admitting: Anesthesiology

## 2021-12-28 DIAGNOSIS — K2970 Gastritis, unspecified, without bleeding: Secondary | ICD-10-CM | POA: Insufficient documentation

## 2021-12-28 DIAGNOSIS — Z1211 Encounter for screening for malignant neoplasm of colon: Secondary | ICD-10-CM | POA: Diagnosis present

## 2021-12-28 DIAGNOSIS — K2289 Other specified disease of esophagus: Secondary | ICD-10-CM | POA: Diagnosis not present

## 2021-12-28 DIAGNOSIS — G2581 Restless legs syndrome: Secondary | ICD-10-CM | POA: Insufficient documentation

## 2021-12-28 DIAGNOSIS — K219 Gastro-esophageal reflux disease without esophagitis: Secondary | ICD-10-CM | POA: Diagnosis not present

## 2021-12-28 DIAGNOSIS — K5730 Diverticulosis of large intestine without perforation or abscess without bleeding: Secondary | ICD-10-CM | POA: Diagnosis not present

## 2021-12-28 DIAGNOSIS — K449 Diaphragmatic hernia without obstruction or gangrene: Secondary | ICD-10-CM | POA: Diagnosis not present

## 2021-12-28 DIAGNOSIS — I10 Essential (primary) hypertension: Secondary | ICD-10-CM | POA: Diagnosis not present

## 2021-12-28 DIAGNOSIS — K640 First degree hemorrhoids: Secondary | ICD-10-CM | POA: Diagnosis not present

## 2021-12-28 HISTORY — PX: ESOPHAGOGASTRODUODENOSCOPY: SHX5428

## 2021-12-28 HISTORY — PX: COLONOSCOPY: SHX5424

## 2021-12-28 SURGERY — COLONOSCOPY
Anesthesia: General

## 2021-12-28 MED ORDER — LIDOCAINE HCL (PF) 2 % IJ SOLN
INTRAMUSCULAR | Status: AC
  Filled 2021-12-28: qty 5

## 2021-12-28 MED ORDER — DEXAMETHASONE SODIUM PHOSPHATE 10 MG/ML IJ SOLN
INTRAMUSCULAR | Status: DC | PRN
  Administered 2021-12-28: 4 mg via INTRAVENOUS

## 2021-12-28 MED ORDER — PROPOFOL 500 MG/50ML IV EMUL
INTRAVENOUS | Status: DC | PRN
  Administered 2021-12-28: 50 mg via INTRAVENOUS
  Administered 2021-12-28: 150 ug/kg/min via INTRAVENOUS

## 2021-12-28 MED ORDER — ONDANSETRON HCL 4 MG/2ML IJ SOLN
INTRAMUSCULAR | Status: DC | PRN
  Administered 2021-12-28: 4 mg via INTRAVENOUS

## 2021-12-28 MED ORDER — PROPOFOL 500 MG/50ML IV EMUL
INTRAVENOUS | Status: AC
  Filled 2021-12-28: qty 50

## 2021-12-28 MED ORDER — SODIUM CHLORIDE 0.9 % IV SOLN: INTRAVENOUS | Status: DC

## 2021-12-28 MED ORDER — ACETAMINOPHEN 500 MG PO TABS
1000.0000 mg | ORAL_TABLET | Freq: Once | ORAL | Status: AC
  Administered 2021-12-28: 1000 mg via ORAL

## 2021-12-28 MED ORDER — SODIUM CHLORIDE 0.9 % IV SOLN: INTRAVENOUS | Status: DC | PRN

## 2021-12-28 MED ORDER — ACETAMINOPHEN 500 MG PO TABS: 1000.0000 mg | ORAL_TABLET | Freq: Once | ORAL | Status: DC

## 2021-12-28 MED ORDER — ACETAMINOPHEN 500 MG PO TABS
ORAL_TABLET | ORAL | Status: DC
Start: 2021-12-28 — End: 2021-12-28
  Filled 2021-12-28: qty 2

## 2021-12-28 NOTE — Op Note (Signed)
[facility] ?Gastroenterology ?Patient Name: Shannon Petersen ?Procedure Date: 12/28/2021 11:47 AM ?MRN: [id number] ?Account #: [id number] ?Date of Birth: [birth year] ?Admit Type: Outpatient ?Age: 71 ?Room: [facility] ENDO ROOM 1 ?Gender: Female ?Note Status: Finalized ?Instrument Name: Upper Endoscope [id number] ?Procedure:             Upper GI endoscopy ?Indications:           Gastro-esophageal reflux disease ?Providers:             Andrey Farmer MD, MD ?Referring MD:          Danae Orleans MD (Referring MD) ?Medicines:             Monitored Anesthesia Care ?Complications:         No immediate complications. Estimated blood loss:  ?                       Minimal. ?Procedure:             Pre-Anesthesia Assessment: ?                       - Prior to the procedure, a History and Physical was  ?                       performed, and patient medications and allergies were  ?                       reviewed. The patient is competent. The risks and  ?                       benefits of the procedure and the sedation options and  ?                       risks were discussed with the patient. All questions  ?                       were answered and informed consent was obtained.  ?                       Patient identification and proposed procedure were  ?                       verified by the physician, the nurse, the  ?                       anesthesiologist, the anesthetist and the technician  ?                       in the endoscopy suite. Mental Status Examination:  ?                       alert and oriented. Airway Examination: normal  ?                       oropharyngeal airway and neck mobility. Respiratory  ?                       Examination: clear to auscultation. CV Examination:  ?  normal. Prophylactic Antibiotics: The patient does not  ?                       require prophylactic antibiotics. Prior  ?                       Anticoagulants: The patient has taken no previous  ?                        anticoagulant or antiplatelet agents. ASA Grade  ?                       Assessment: II - A patient with mild systemic disease.  ?                       After reviewing the risks and benefits, the patient  ?                       was deemed in satisfactory condition to undergo the  ?                       procedure. The anesthesia plan was to use monitored  ?                       anesthesia care (MAC). Immediately prior to  ?                       administration of medications, the patient was  ?                       re-assessed for adequacy to receive sedatives. The  ?                       heart rate, respiratory rate, oxygen saturations,  ?                       blood pressure, adequacy of pulmonary ventilation, and  ?                       response to care were monitored throughout the  ?                       procedure. The physical status of the patient was  ?                       re-assessed after the procedure. ?                       After obtaining informed consent, the endoscope was  ?                       passed under direct vision. Throughout the procedure,  ?                       the patient's blood pressure, pulse, and oxygen  ?                       saturations were monitored continuously. The Endoscope  ?  was introduced through the mouth, and advanced to the  ?                       second part of duodenum. The upper GI endoscopy was  ?                       accomplished without difficulty. The patient tolerated  ?                       the procedure well. ?Findings: ?     Bile was found in the middle third of the esophagus. ?     A small hiatal hernia was present. ?     The exam of the esophagus was otherwise normal. ?     The entire examined stomach was normal. Biopsies were taken with a cold  ?     forceps for Helicobacter pylori testing. Estimated blood loss was  ?     minimal. ?     The examined duodenum was normal. ?Impression:            - Bile in the  middle third of the esophagus. ?                       - Small hiatal hernia. ?                       - Normal stomach. Biopsied. ?                       - Normal examined duodenum. ?Recommendation:        - Await pathology results. ?                       - Perform a colonoscopy today. ?Procedure Code(s):     --- Professional --- ?                       [phone], Esophagogastroduodenoscopy, flexible,  ?                       transoral; with biopsy, single or multiple ?Diagnosis Code(s):     --- Professional --- ?                       K44.9, Diaphragmatic hernia without obstruction or  ?                       gangrene ?                       K21.9, Gastro-esophageal reflux disease without  ?                       esophagitis ?CPT copyright 2019 American Medical Association. All rights reserved. ?The codes documented in this report are preliminary and upon coder review may  ?be revised to meet current compliance requirements. ?Andrey Farmer MD, MD ?12/28/2021 12:35:40 PM ?Number of Addenda: 0 ?Note Initiated On: 12/28/2021 11:47 AM ?Estimated Blood Loss:  Estimated blood loss was minimal. ?     [facility] ?

## 2021-12-28 NOTE — Anesthesia Postprocedure Evaluation (Signed)
Anesthesia Post Note ? ?Patient: Shannon Petersen ? ?Procedure(s) Performed: COLONOSCOPY ?ESOPHAGOGASTRODUODENOSCOPY (EGD) ? ?Patient location during evaluation: Endoscopy ?Anesthesia Type: General ?Level of consciousness: awake and alert ?Pain management: pain level controlled ?Vital Signs Assessment: post-procedure vital signs reviewed and stable ?Respiratory status: spontaneous breathing, nonlabored ventilation and respiratory function stable ?Cardiovascular status: blood pressure returned to baseline and stable ?Postop Assessment: no apparent nausea or vomiting ?Anesthetic complications: no ? ? ?No notable events documented. ? ? ?Last Vitals:  ?Vitals:  ? 12/28/21 1249 12/28/21 1259  ?BP: 118/77 111/76  ?Pulse: 93 93  ?Resp: 15 17  ?Temp: 37.8 ?C   ?SpO2: 100% 100%  ?  ?Last Pain:  ?Vitals:  ? 12/28/21 1259  ?TempSrc:   ?PainSc: 0-No pain  ? ? ?  ?  ?  ?  ?  ?  ? ?Foye Deer ? ? ? ? ?

## 2021-12-28 NOTE — Transfer of Care (Signed)
Immediate Anesthesia Transfer of Care Note ? ?Patient: Shannon Petersen ? ?Procedure(s) Performed: COLONOSCOPY ?ESOPHAGOGASTRODUODENOSCOPY (EGD) ? ?Patient Location: PACU and Endoscopy Unit ? ?Anesthesia Type:General ? ?Level of Consciousness: drowsy and patient cooperative ? ?Airway & Oxygen Therapy: Patient Spontanous Breathing ? ?Post-op Assessment: Report given to RN and Post -op Vital signs reviewed and stable ? ?Post vital signs: Reviewed and stable ? ?Last Vitals:  ?Vitals Value Taken Time  ?BP 103/77 12/28/21 1229  ?Temp 36.7 ?C 12/28/21 1229  ?Pulse 98 12/28/21 1229  ?Resp 14 12/28/21 1229  ?SpO2 98 % 12/28/21 1229  ?Vitals shown include unvalidated device data. ? ?Last Pain:  ?Vitals:  ? 12/28/21 1229  ?TempSrc: Temporal  ?PainSc: 0-No pain  ?   ? ?  ? ?Complications: No notable events documented. ?

## 2021-12-28 NOTE — Interval H&P Note (Signed)
History and Physical Interval Note: ? ?12/28/2021 ?11:56 AM ? ?Shannon Petersen  has presented today for surgery, with the diagnosis of Personal history of colonic polyps (Z86.010) ?Gastroesophageal reflux disease, unspecified whether esophagitis present (K21.9).  The various methods of treatment have been discussed with the patient and family. After consideration of risks, benefits and other options for treatment, the patient has consented to  Procedure(s): ?COLONOSCOPY (N/A) ?ESOPHAGOGASTRODUODENOSCOPY (EGD) (N/A) as a surgical intervention.  The patient's history has been reviewed, patient examined, no change in status, stable for surgery.  I have reviewed the patient's chart and labs.  Questions were answered to the patient's satisfaction.   ? ? ?Hilton Cork Marcello Tuzzolino ? ?Ok to proceed with EGD/Colonoscopy ?

## 2021-12-28 NOTE — OR Nursing (Signed)
Notified dr Wynetta Emery regarding pt feeling hot, check temp 100F. New orders rec'd to given tylenol PO. Okay for Discharge. ?

## 2021-12-28 NOTE — H&P (Signed)
Outpatient short stay form Pre-procedure ?12/28/2021  ?Regis Bill, MD ? ?Primary Physician: Jenell Milliner, MD ? ?Reason for visit:  GERD/Surveillance colonoscopy ? ?History of present illness:   ? ?71 y/o lady with history of GERD here for EGD for evaluation. Also receiving colonoscopy for history of polyps of unknown type. No family history of GI malignancies. No blood thinners. History of partial hysterectomy. Last colonoscopy was about 8 years ago. ? ? ? ?Current Facility-Administered Medications:  ?  0.9 %  sodium chloride infusion, , Intravenous, Continuous, Lien Lyman, Rossie Muskrat, MD, Last Rate: 20 mL/hr at 12/28/21 1109, New Bag at 12/28/21 1109 ? ?Medications Prior to Admission  ?Medication Sig Dispense Refill Last Dose  ? AMLODIPINE BESYLATE PO Take by mouth.   12/27/2021  ? gabapentin (NEURONTIN) 300 MG capsule Take 300 mg by mouth 3 (three) times daily.   12/27/2021  ? meloxicam (MOBIC) 15 MG tablet Take 1 tablet (15 mg total) by mouth daily as needed for pain. 14 tablet 0 12/27/2021  ? omeprazole (PRILOSEC) 20 MG capsule Take 20 mg by mouth daily.   12/27/2021  ? TRAZODONE HCL PO Take by mouth daily.   12/27/2021  ? TRIAMTERENE-HCTZ PO Take by mouth daily.   12/27/2021  ? cyclobenzaprine (FLEXERIL) 10 MG tablet Take 1 tablet (10 mg total) by mouth 3 (three) times daily as needed for muscle spasms. (Patient not taking: Reported on 12/28/2021) 30 tablet 0 Not Taking  ? ranitidine (ZANTAC) 75 MG tablet Take 75 mg by mouth 2 (two) times daily. (Patient not taking: Reported on 12/28/2021)   Not Taking  ? ? ? ?No Known Allergies ? ? ?Past Medical History:  ?Diagnosis Date  ? Acid reflux   ? Herniated disc, cervical   ? Hypertension   ? Pericarditis 2012  ? PONV (postoperative nausea and vomiting)   ? Restless leg   ? Wears contact lenses   ? Wrist disorder   ? right - damaged tendon.  limited use  ? ? ?Review of systems:  Otherwise negative.  ? ? ?Physical Exam ? ?Gen: Alert, oriented. Appears stated age.  ?HEENT:  PERRLA. ?Lungs: No respiratory distress ?CV: RRR ?Abd: soft, benign, no masses ?Ext: No edema ? ? ? ?Planned procedures: Proceed with EGD/colonoscopy. The patient understands the nature of the planned procedure, indications, risks, alternatives and potential complications including but not limited to bleeding, infection, perforation, damage to internal organs and possible oversedation/side effects from anesthesia. The patient agrees and gives consent to proceed.  ?Please refer to procedure notes for findings, recommendations and patient disposition/instructions.  ? ? ? ?Regis Bill, MD ?[facility] ? ? ? ?  ? ?

## 2021-12-28 NOTE — Anesthesia Preprocedure Evaluation (Addendum)
Anesthesia Evaluation  ?Patient identified by MRN, date of birth, ID band ?Patient awake ? ? ? ?Reviewed: ?Allergy & Precautions, NPO status , Patient's Chart, lab work & pertinent test results ? ?History of Anesthesia Complications ?(+) PONV and history of anesthetic complications ? ?Airway ?Mallampati: IV ? ?TM Distance: >3 FB ?Neck ROM: Full ? ? ? Dental ?no notable dental hx. ? ?  ?Pulmonary ?neg pulmonary ROS,  ?  ?Pulmonary exam normal ? ? ? ? ? ? ? Cardiovascular ?hypertension, Pt. on medications ?Normal cardiovascular exam ? ? ?  ?Neuro/Psych ?negative neurological ROS ? negative psych ROS  ? GI/Hepatic ?Neg liver ROS, GERD  Medicated and Controlled,  ?Endo/Other  ?negative endocrine ROS ? Renal/GU ?negative Renal ROS  ?negative genitourinary ?  ?Musculoskeletal ? ? Abdominal ?Normal abdominal exam  (+)   ?Peds ? Hematology ?negative hematology ROS ?(+)   ?Anesthesia Other Findings ?Past Medical History: ?No date: Acid reflux ?No date: Herniated disc, cervical ?No date: Hypertension ?2012: Pericarditis ?No date: PONV (postoperative nausea and vomiting) ?No date: Restless leg ?No date: Wears contact lenses ?No date: Wrist disorder ?    Comment:  right - damaged tendon.  limited use ? ?Past Surgical History: ?No date: ABDOMINAL HYSTERECTOMY ?01/17/2017: CATARACT EXTRACTION W/PHACO; Left ?    Comment:  Procedure: CATARACT EXTRACTION PHACO AND INTRAOCULAR  ?             LENS PLACEMENT (IOC) left symfony lens;  Surgeon: Elige Radon ?             Girtha Rm, MD;  Location: [facility];  Service:  ?             Ophthalmology;  Laterality: Left;  symfony lens ?02/14/2017: CATARACT EXTRACTION W/PHACO; Right ?    Comment:  Procedure: CATARACT EXTRACTION PHACO AND INTRAOCULAR  ?             LENS PLACEMENT (IOC)  symfony lens right;  Surgeon: Brooke Dare, ?             Earl Gala, MD;  Location: [facility];   ?             Service: Ophthalmology;  Laterality: Right; ?No date:  ELBOW SURGERY; Right ?    Comment:  topaz ?No date: EYE SURGERY ?No date: TUBAL LIGATION ?No date: WRIST SURGERY; Right ?    Comment:  tendon damage ? ? ? ? Reproductive/Obstetrics ?negative OB ROS ? ?  ? ? ? ? ? ? ? ? ? ? ? ? ? ?  ?  ? ? ? ? ? ? ? ?Anesthesia Physical ? ?Anesthesia Plan ? ?ASA: 3 ? ?Anesthesia Plan: General  ? ?Post-op Pain Management:   ? ?Induction: Intravenous ? ?PONV Risk Score and Plan: Propofol infusion and TIVA ? ?Airway Management Planned: Natural Airway ? ?Additional Equipment:  ? ?Intra-op Plan:  ? ?Post-operative Plan:  ? ?Informed Consent: I have reviewed the patients History and Physical, chart, labs and discussed the procedure including the risks, benefits and alternatives for the proposed anesthesia with the patient or authorized representative who has indicated his/her understanding and acceptance.  ? ? ? ?Dental advisory given ? ?Plan Discussed with: CRNA ? ?Anesthesia Plan Comments:   ? ? ? ? ?Anesthesia Quick Evaluation ? ?

## 2021-12-28 NOTE — Op Note (Signed)
[facility] ?Gastroenterology ?Patient Name: Shannon Petersen ?Procedure Date: 12/28/2021 11:47 AM ?MRN: [id number] ?Account #: [id number] ?Date of Birth: [birth year] ?Admit Type: Outpatient ?Age: 71 ?Room: [facility] ENDO ROOM 1 ?Gender: Female ?Note Status: Finalized ?Instrument Name: Colonscope [id number] ?Procedure:             Colonoscopy ?Indications:           Surveillance: Personal history of colonic polyps  ?                       (unknown histology) on last colonoscopy more than 5  ?                       years ago ?Providers:             Andrey Farmer MD, MD ?Referring MD:          Danae Orleans MD (Referring MD) ?Medicines:             Monitored Anesthesia Care ?Complications:         No immediate complications. ?Procedure:             Pre-Anesthesia Assessment: ?                       - Prior to the procedure, a History and Physical was  ?                       performed, and patient medications and allergies were  ?                       reviewed. The patient is competent. The risks and  ?                       benefits of the procedure and the sedation options and  ?                       risks were discussed with the patient. All questions  ?                       were answered and informed consent was obtained.  ?                       Patient identification and proposed procedure were  ?                       verified by the physician, the nurse, the  ?                       anesthesiologist, the anesthetist and the technician  ?                       in the endoscopy suite. Mental Status Examination:  ?                       alert and oriented. Airway Examination: normal  ?                       oropharyngeal airway and neck mobility. Respiratory  ?  Examination: clear to auscultation. CV Examination:  ?                       normal. Prophylactic Antibiotics: The patient does not  ?                       require prophylactic antibiotics. Prior  ?                        Anticoagulants: The patient has taken no previous  ?                       anticoagulant or antiplatelet agents. ASA Grade  ?                       Assessment: II - A patient with mild systemic disease.  ?                       After reviewing the risks and benefits, the patient  ?                       was deemed in satisfactory condition to undergo the  ?                       procedure. The anesthesia plan was to use monitored  ?                       anesthesia care (MAC). Immediately prior to  ?                       administration of medications, the patient was  ?                       re-assessed for adequacy to receive sedatives. The  ?                       heart rate, respiratory rate, oxygen saturations,  ?                       blood pressure, adequacy of pulmonary ventilation, and  ?                       response to care were monitored throughout the  ?                       procedure. The physical status of the patient was  ?                       re-assessed after the procedure. ?                       After obtaining informed consent, the colonoscope was  ?                       passed under direct vision. Throughout the procedure,  ?                       the patient's blood pressure, pulse, and oxygen  ?  saturations were monitored continuously. The  ?                       Colonoscope was introduced through the anus and  ?                       advanced to the the cecum, identified by appendiceal  ?                       orifice and ileocecal valve. The colonoscopy was  ?                       performed without difficulty. The patient tolerated  ?                       the procedure well. The quality of the bowel  ?                       preparation was good. ?Findings: ?     The perianal and digital rectal examinations were normal. ?     A few small-mouthed diverticula were found in the sigmoid colon. ?     Internal hemorrhoids were found during retroflexion. The  hemorrhoids  ?     were Grade I (internal hemorrhoids that do not prolapse). ?     The exam was otherwise without abnormality on direct and retroflexion  ?     views. ?Impression:            - Diverticulosis in the sigmoid colon. ?                       - Internal hemorrhoids. ?                       - The examination was otherwise normal on direct and  ?                       retroflexion views. ?                       - No specimens collected. ?Recommendation:        - Discharge patient to home. ?                       - Resume previous diet. ?                       - Continue present medications. ?                       - Repeat colonoscopy is not recommended due to current  ?                       age (51 years or older) for surveillance. ?                       - Return to referring physician as previously  ?                       scheduled. ?Procedure Code(s):     --- Professional --- ?  G0105, Colorectal cancer screening; colonoscopy on  ?                       individual at high risk ?Diagnosis Code(s):     --- Professional --- ?                       Z86.010, Personal history of colonic polyps ?                       K64.0, First degree hemorrhoids ?                       K57.30, Diverticulosis of large intestine without  ?                       perforation or abscess without bleeding ?CPT copyright 2019 [employer] Association. All rights reserved. ?The codes documented in this report are preliminary and upon coder review may  ?be revised to meet current compliance requirements. ?Andrey Farmer MD, MD ?12/28/2021 12:43:05 PM ?Number of Addenda: 0 ?Note Initiated On: 12/28/2021 11:47 AM ?Scope Withdrawal Time: 0 hours 6 minutes 6 seconds  ?Total Procedure Duration: 0 hours 12 minutes 4 seconds  ?Estimated Blood Loss:  Estimated blood loss: none. ?     [facility] ?

## 2021-12-29 LAB — SURGICAL PATHOLOGY

## 2023-04-11 ENCOUNTER — Other Ambulatory Visit: Payer: Self-pay | Admitting: Sports Medicine

## 2023-04-11 DIAGNOSIS — M76892 Other specified enthesopathies of left lower limb, excluding foot: Secondary | ICD-10-CM

## 2023-04-11 DIAGNOSIS — M25462 Effusion, left knee: Secondary | ICD-10-CM

## 2023-04-11 DIAGNOSIS — G8929 Other chronic pain: Secondary | ICD-10-CM

## 2023-04-17 ENCOUNTER — Encounter: Payer: Self-pay | Admitting: Sports Medicine

## 2023-04-18 ENCOUNTER — Ambulatory Visit
Admission: RE | Admit: 2023-04-18 | Discharge: 2023-04-18 | Disposition: A | Discharge status: Home / Self Care | Payer: Medicare PPO | Source: Ambulatory Visit | Attending: Sports Medicine | Admitting: Sports Medicine

## 2023-04-18 DIAGNOSIS — M76892 Other specified enthesopathies of left lower limb, excluding foot: Secondary | ICD-10-CM

## 2023-04-18 DIAGNOSIS — G8929 Other chronic pain: Secondary | ICD-10-CM

## 2023-04-18 DIAGNOSIS — M25462 Effusion, left knee: Secondary | ICD-10-CM

## 2023-05-12 ENCOUNTER — Other Ambulatory Visit: Payer: Self-pay | Admitting: Orthopedic Surgery

## 2023-05-18 ENCOUNTER — Encounter: Payer: Self-pay | Admitting: Orthopedic Surgery

## 2023-05-19 ENCOUNTER — Encounter (HOSPITAL_COMMUNITY): Payer: Self-pay | Admitting: Anesthesiology

## 2023-05-19 NOTE — Anesthesia Preprocedure Evaluation (Signed)
Anesthesia Evaluation    Airway        Dental   Pulmonary           Cardiovascular hypertension,      Neuro/Psych    GI/Hepatic   Endo/Other    Renal/GU      Musculoskeletal   Abdominal   Peds  Hematology   Anesthesia Other Findings Hypertension  Acid reflux Restless leg  PONV (postoperative nausea and vomiting) Pericarditis  Wears contact lenses Wrist disorder  Herniated disc, cervical Chronic thoracic back pain  Vertigo    Reproductive/Obstetrics                              Anesthesia Physical Anesthesia Plan Anesthesia Quick Evaluation

## 2023-05-23 ENCOUNTER — Encounter
Admission: RE | Admit: 2023-05-23 | Discharge: 2023-05-23 | Disposition: A | Discharge status: Home / Self Care | Payer: Medicare PPO | Source: Ambulatory Visit | Attending: Orthopedic Surgery | Admitting: Orthopedic Surgery

## 2023-05-23 VITALS — Ht 59.5 in | Wt 132.5 lb

## 2023-05-23 DIAGNOSIS — Z01812 Encounter for preprocedural laboratory examination: Secondary | ICD-10-CM

## 2023-05-23 DIAGNOSIS — I10 Essential (primary) hypertension: Secondary | ICD-10-CM

## 2023-05-23 HISTORY — DX: Hyperlipidemia, unspecified: E78.5

## 2023-05-23 HISTORY — DX: Occlusion and stenosis of right carotid artery: I65.21

## 2023-05-23 HISTORY — DX: Fatty (change of) liver, not elsewhere classified: K76.0

## 2023-05-23 HISTORY — DX: Diverticulosis of intestine, part unspecified, without perforation or abscess without bleeding: K57.90

## 2023-05-23 MED ORDER — LACTATED RINGERS IV SOLN
INTRAVENOUS | Status: DC
  Filled 2023-05-23: qty 1000

## 2023-05-23 NOTE — Patient Instructions (Addendum)
Your procedure is scheduled on: Friday, August 30 Report to the Registration Desk on the 1st floor of the [facility] To find out your arrival time, please call [phone] between 1PM - 3PM on: Thursday, August 29 If your arrival time is 6:00 am, do not arrive before that time as the [facility] entrance doors do not open until 6:00 am.  REMEMBER: Instructions that are not followed completely may result in serious medical risk, up to and including death; or upon the discretion of your surgeon and anesthesiologist your surgery may need to be rescheduled.  Do not eat food after midnight the night before surgery.  No gum chewing or hard candies.  You may however, drink CLEAR liquids up to 2 hours before you are scheduled to arrive for your surgery. Do not drink anything within 2 hours of your scheduled arrival time.  Clear liquids include: - water  - apple juice without pulp - gatorade (not RED colors) - black coffee or tea (Do NOT add milk or creamers to the coffee or tea) Do NOT drink anything that is not on this list.  In addition, your doctor has ordered for you to drink the provided:  Ensure Pre-Surgery Clear Carbohydrate Drink  Drinking this carbohydrate drink up to two hours before surgery helps to reduce insulin resistance and improve patient outcomes. Please complete drinking 2 hours before scheduled arrival time.  One week prior to surgery: starting today, August 27 Stop aspirin and Anti-inflammatories (NSAIDS) such as Advil, Aleve, Ibuprofen, Motrin, Naproxen, Naprosyn and Aspirin based products such as Excedrin, Goody's Powder, BC Powder. Stop ANY OVER THE COUNTER supplements until after surgery. Stop vitamin C, multiple vitamins You may however, continue to take Tylenol if needed for pain up until the day of surgery.  Continue taking all prescribed medications   TAKE ONLY THESE MEDICATIONS THE MORNING OF SURGERY WITH A SIP OF WATER:  Amlodipine Omeprazole (Prilosec)  - (take one the night before and one on the morning of surgery - helps to prevent nausea after surgery.)  No Alcohol for 24 hours before or after surgery.  No Smoking including e-cigarettes for 24 hours before surgery.  No chewable tobacco products for at least 6 hours before surgery.  No nicotine patches on the day of surgery.  Do not use any "recreational" drugs for at least a week (preferably 2 weeks) before your surgery.  Please be advised that the combination of cocaine and anesthesia may have negative outcomes, up to and including death. If you test positive for cocaine, your surgery will be cancelled.  On the morning of surgery brush your teeth with toothpaste and water, you may rinse your mouth with mouthwash if you wish. Do not swallow any toothpaste or mouthwash.  Use CHG Soap as directed on instruction sheet.  Do not wear jewelry, make-up, hairpins, clips or nail polish.  Do not wear lotions, powders, or perfumes.   Do not shave body hair from the neck down 48 hours before surgery.  Contact lenses, hearing aids and dentures may not be worn into surgery.  Do not bring valuables to the hospital. [facility] is not responsible for any missing/lost belongings or valuables.   Notify your doctor if there is any change in your medical condition (cold, fever, infection).  Wear comfortable clothing (specific to your surgery type) to the hospital.  After surgery, you can help prevent lung complications by doing breathing exercises.  Take deep breaths and cough every 1-2 hours. Your doctor may  order a device called an Incentive Spirometer to help you take deep breaths.  If you are being discharged the day of surgery, you will not be allowed to drive home. You will need a responsible individual to drive you home and stay with you for 24 hours after surgery.   If you are taking public transportation, you will need to have a responsible individual with you.  Please call the  Pre-admissions Testing Dept. at [phone] if you have any questions about these instructions.  Surgery Visitation Policy:  Patients having surgery or a procedure may have two visitors.  Children under the age of 20 must have an adult with them who is not the patient.     Preparing for Surgery with CHLORHEXIDINE GLUCONATE (CHG) Soap  Chlorhexidine Gluconate (CHG) Soap  o An antiseptic cleaner that kills germs and bonds with the skin to continue killing germs even after washing  o Used for showering the night before surgery and morning of surgery  Before surgery, you can play an important role by reducing the number of germs on your skin.  CHG (Chlorhexidine gluconate) soap is an antiseptic cleanser which kills germs and bonds with the skin to continue killing germs even after washing.  Please do not use if you have an allergy to CHG or antibacterial soaps. If your skin becomes reddened/irritated stop using the CHG.  1. Shower the NIGHT BEFORE SURGERY and the MORNING OF SURGERY with CHG soap.  2. If you choose to wash your hair, wash your hair first as usual with your normal shampoo.  3. After shampooing, rinse your hair and body thoroughly to remove the shampoo.  4. Use CHG as you would any other liquid soap. You can apply CHG directly to the skin and wash gently with a scrungie or a clean washcloth.  5. Apply the CHG soap to your body only from the neck down. Do not use on open wounds or open sores. Avoid contact with your eyes, ears, mouth, and genitals (private parts). Wash face and genitals (private parts) with your normal soap.  6. Wash thoroughly, paying special attention to the area where your surgery will be performed.  7. Thoroughly rinse your body with warm water.  8. Do not shower/wash with your normal soap after using and rinsing off the CHG soap.  9. Pat yourself dry with a clean towel.  10. Wear clean pajamas to bed the night before surgery.  12. Place  clean sheets on your bed the night of your first shower and do not sleep with pets.  13. Shower again with the CHG soap on the day of surgery prior to arriving at the hospital.  14. Do not apply any deodorants/lotions/powders.  15. Please wear clean clothes to the hospital.

## 2023-05-24 ENCOUNTER — Other Ambulatory Visit: Payer: Medicare PPO

## 2023-05-26 ENCOUNTER — Ambulatory Visit: Admit: 2023-05-26 | Payer: Medicare PPO | Admitting: Orthopedic Surgery

## 2023-05-26 HISTORY — DX: Other chronic pain: G89.29

## 2023-05-26 HISTORY — DX: Dizziness and giddiness: R42

## 2023-05-26 SURGERY — ARTHROSCOPY, KNEE, WITH MEDIAL MENISCECTOMY
Anesthesia: Choice | Site: Knee | Laterality: Left

## 2023-05-30 ENCOUNTER — Other Ambulatory Visit: Payer: Self-pay | Admitting: Orthopedic Surgery

## 2023-06-01 ENCOUNTER — Encounter: Payer: Self-pay | Admitting: Orthopedic Surgery

## 2023-06-05 ENCOUNTER — Encounter
Admission: RE | Disposition: A | Discharge status: Home / Self Care | Payer: Self-pay | Source: Ambulatory Visit | Attending: Orthopedic Surgery

## 2023-06-05 ENCOUNTER — Ambulatory Visit: Payer: Medicare PPO | Admitting: Anesthesiology

## 2023-06-05 ENCOUNTER — Other Ambulatory Visit: Payer: Self-pay

## 2023-06-05 ENCOUNTER — Encounter: Payer: Self-pay | Admitting: Orthopedic Surgery

## 2023-06-05 ENCOUNTER — Ambulatory Visit
Admission: RE | Admit: 2023-06-05 | Discharge: 2023-06-05 | Disposition: A | Discharge status: Home / Self Care | Payer: Medicare PPO | Source: Ambulatory Visit | Attending: Orthopedic Surgery | Admitting: Orthopedic Surgery

## 2023-06-05 DIAGNOSIS — Z01812 Encounter for preprocedural laboratory examination: Secondary | ICD-10-CM

## 2023-06-05 DIAGNOSIS — I10 Essential (primary) hypertension: Secondary | ICD-10-CM | POA: Insufficient documentation

## 2023-06-05 DIAGNOSIS — K219 Gastro-esophageal reflux disease without esophagitis: Secondary | ICD-10-CM | POA: Insufficient documentation

## 2023-06-05 DIAGNOSIS — X58XXXA Exposure to other specified factors, initial encounter: Secondary | ICD-10-CM | POA: Diagnosis not present

## 2023-06-05 DIAGNOSIS — G8929 Other chronic pain: Secondary | ICD-10-CM | POA: Diagnosis not present

## 2023-06-05 DIAGNOSIS — S83242A Other tear of medial meniscus, current injury, left knee, initial encounter: Secondary | ICD-10-CM | POA: Diagnosis present

## 2023-06-05 DIAGNOSIS — M546 Pain in thoracic spine: Secondary | ICD-10-CM | POA: Diagnosis not present

## 2023-06-05 HISTORY — PX: KNEE ARTHROSCOPY WITH MEDIAL MENISECTOMY: SHX5651

## 2023-06-05 SURGERY — ARTHROSCOPY, KNEE, WITH MEDIAL MENISCECTOMY
Anesthesia: General | Site: Knee | Laterality: Left

## 2023-06-05 MED ORDER — LACTATED RINGERS IV SOLN: INTRAVENOUS | Status: DC

## 2023-06-05 MED ORDER — ONDANSETRON 4 MG PO TBDP: 4.0000 mg | ORAL_TABLET | Freq: Three times a day (TID) | ORAL | 0 refills | Status: AC | PRN

## 2023-06-05 MED ORDER — CEFAZOLIN SODIUM-DEXTROSE 2-4 GM/100ML-% IV SOLN
2.0000 g | INTRAVENOUS | Status: AC
  Administered 2023-06-05: 2 g via INTRAVENOUS

## 2023-06-05 MED ORDER — ASPIRIN 325 MG PO TBEC: 325.0000 mg | DELAYED_RELEASE_TABLET | Freq: Every day | ORAL | 0 refills | Status: AC

## 2023-06-05 MED ORDER — FENTANYL CITRATE (PF) 100 MCG/2ML IJ SOLN
INTRAMUSCULAR | Status: DC | PRN
  Administered 2023-06-05 (×2): 25 ug via INTRAVENOUS

## 2023-06-05 MED ORDER — RINGERS IRRIGATION IR SOLN
Status: DC | PRN
  Administered 2023-06-05: 6000 mL

## 2023-06-05 MED ORDER — LIDOCAINE-EPINEPHRINE 1 %-1:100000 IJ SOLN
INTRAMUSCULAR | Status: DC | PRN
  Administered 2023-06-05: 2 mL
  Administered 2023-06-05: 10 mL

## 2023-06-05 MED ORDER — IBUPROFEN 800 MG PO TABS: 800.0000 mg | ORAL_TABLET | Freq: Three times a day (TID) | ORAL | 0 refills | Status: AC

## 2023-06-05 MED ORDER — ONDANSETRON HCL 4 MG/2ML IJ SOLN
INTRAMUSCULAR | Status: DC | PRN
  Administered 2023-06-05: 4 mg via INTRAVENOUS

## 2023-06-05 MED ORDER — DIPHENHYDRAMINE HCL 50 MG/ML IJ SOLN
INTRAMUSCULAR | Status: DC | PRN
Start: 2023-06-05 — End: 2023-06-05
  Administered 2023-06-05: 12.5 mg via INTRAVENOUS

## 2023-06-05 MED ORDER — FENTANYL CITRATE PF 50 MCG/ML IJ SOSY: 25.0000 ug | PREFILLED_SYRINGE | INTRAMUSCULAR | Status: DC | PRN

## 2023-06-05 MED ORDER — PROPOFOL 500 MG/50ML IV EMUL
INTRAVENOUS | Status: DC | PRN
Start: 2023-06-05 — End: 2023-06-05
  Administered 2023-06-05: 100 ug/kg/min via INTRAVENOUS

## 2023-06-05 MED ORDER — HYDROCODONE-ACETAMINOPHEN 5-325 MG PO TABS: 1.0000 | ORAL_TABLET | ORAL | 0 refills | Status: DC | PRN

## 2023-06-05 MED ORDER — ORAL CARE MOUTH RINSE: 15.0000 mL | Freq: Once | OROMUCOSAL | Status: DC

## 2023-06-05 MED ORDER — MIDAZOLAM HCL 5 MG/5ML IJ SOLN
INTRAMUSCULAR | Status: DC | PRN
  Administered 2023-06-05: 2 mg via INTRAVENOUS

## 2023-06-05 MED ORDER — PROPOFOL 10 MG/ML IV BOLUS
INTRAVENOUS | Status: DC | PRN
  Administered 2023-06-05: 60 mg via INTRAVENOUS
  Administered 2023-06-05: 140 mg via INTRAVENOUS

## 2023-06-05 MED ORDER — CHLORHEXIDINE GLUCONATE 0.12 % MT SOLN: 15.0000 mL | Freq: Once | OROMUCOSAL | Status: DC

## 2023-06-05 MED ORDER — ACETAMINOPHEN 500 MG PO TABS: 1000.0000 mg | ORAL_TABLET | Freq: Three times a day (TID) | ORAL | 2 refills | Status: AC

## 2023-06-05 MED ORDER — LIDOCAINE HCL (CARDIAC) PF 100 MG/5ML IV SOSY
PREFILLED_SYRINGE | INTRAVENOUS | Status: DC | PRN
  Administered 2023-06-05: 60 mg via INTRATRACHEAL

## 2023-06-05 MED ORDER — DEXAMETHASONE SODIUM PHOSPHATE 4 MG/ML IJ SOLN
INTRAMUSCULAR | Status: DC | PRN
  Administered 2023-06-05: 8 mg via INTRAVENOUS

## 2023-06-05 MED ORDER — ONDANSETRON HCL 4 MG/2ML IJ SOLN: 4.0000 mg | Freq: Once | INTRAMUSCULAR | Status: DC | PRN

## 2023-06-05 SURGICAL SUPPLY — 46 items
ADAPTER IRRIG TUBE 2 SPIKE SOL (ADAPTER) ×2 IMPLANT
ADH SKN CLS APL DERMABOND .7 (GAUZE/BANDAGES/DRESSINGS) ×2
ADPR TBG 2 SPK PMP STRL ASCP (ADAPTER) ×2
APL PRP STRL LF DISP 70% ISPRP (MISCELLANEOUS)
BLADE FULL RADIUS 3.5 (BLADE) ×1 IMPLANT
BLADE SHAVER 4.5X7 STR FR (MISCELLANEOUS) ×2 IMPLANT
BLADE SURG SZ10 CARB STEEL (BLADE) ×2 IMPLANT
BLADE SURG SZ11 CARB STEEL (BLADE) ×2 IMPLANT
BNDG ESMARCH 6 X 12 STRL LF (GAUZE/BANDAGES/DRESSINGS) ×2
BNDG ESMARCH 6X12 STRL LF (GAUZE/BANDAGES/DRESSINGS) ×2 IMPLANT
BRUSH SCRUB EZ 4% CHG (MISCELLANEOUS) ×1 IMPLANT
BUR BR 5.5 WIDE MOUTH (BURR) IMPLANT
CHLORAPREP W/TINT 26 (MISCELLANEOUS) ×1 IMPLANT
COOLER POLAR GLACIER W/PUMP (MISCELLANEOUS) ×2 IMPLANT
COVER LIGHT HANDLE 1/PK (MISCELLANEOUS) ×2 IMPLANT
DERMABOND ADVANCED .7 DNX12 (GAUZE/BANDAGES/DRESSINGS) ×2 IMPLANT
DRAPE EXTREMITY T 121X128X90 (DISPOSABLE) ×1 IMPLANT
DRAPE IMP U-DRAPE 54X76 (DRAPES) ×2 IMPLANT
ELECT REM PT RETURN 9FT ADLT (ELECTROSURGICAL) ×2
ELECTRODE REM PT RTRN 9FT ADLT (ELECTROSURGICAL) ×1 IMPLANT
GAUZE SPONGE 4X4 12PLY STRL (GAUZE/BANDAGES/DRESSINGS) ×2 IMPLANT
GLOVE BIOGEL PI IND STRL 8 (GLOVE) ×2 IMPLANT
GLOVE SURG ORTHO 8.0 STRL STRW (GLOVE) ×2 IMPLANT
GLOVE SURG SYN 7.5 E (GLOVE) ×2
GLOVE SURG SYN 7.5 PF PI (GLOVE) ×1 IMPLANT
GOWN STRL REUS W/ TWL LRG LVL3 (GOWN DISPOSABLE) ×2 IMPLANT
GOWN STRL REUS W/ TWL XL LVL3 (GOWN DISPOSABLE) ×2 IMPLANT
GOWN STRL REUS W/TWL LRG LVL3 (GOWN DISPOSABLE) ×2
GOWN STRL REUS W/TWL XL LVL3 (GOWN DISPOSABLE) ×2
IV LACTATED RINGER IRRG 3000ML (IV SOLUTION) ×4
IV LR IRRIG 3000ML ARTHROMATIC (IV SOLUTION) ×6 IMPLANT
KIT TURNOVER KIT A (KITS) ×2 IMPLANT
MANIFOLD NEPTUNE II (INSTRUMENTS) ×3 IMPLANT
MAT ABSORB FLUID 56X50 GRAY (MISCELLANEOUS) ×3 IMPLANT
PACK ARTHROSCOPY KNEE (MISCELLANEOUS) ×2 IMPLANT
PAD ABD DERMACEA PRESS 5X9 (GAUZE/BANDAGES/DRESSINGS) ×4 IMPLANT
PAD WRAPON POLAR KNEE (MISCELLANEOUS) ×1 IMPLANT
SPONGE T-LAP 18X18 ~~LOC~~+RFID (SPONGE) ×2 IMPLANT
SUT ETHILON 3-0 FS-10 30 BLK (SUTURE) ×2
SUTURE EHLN 3-0 FS-10 30 BLK (SUTURE) ×1 IMPLANT
TOWEL OR 17X26 4PK STRL BLUE (TOWEL DISPOSABLE) ×2 IMPLANT
TUBE SET DOUBLEFLO INFLOW (TUBING) ×2 IMPLANT
TUBE SET DOUBLEFLO OUTFLOW (TUBING) ×2 IMPLANT
WAND WEREWOLF FLOW 90D (MISCELLANEOUS) ×2 IMPLANT
WATER STERILE IRR 500ML POUR (IV SOLUTION) ×1 IMPLANT
WRAPON POLAR PAD KNEE (MISCELLANEOUS) ×2

## 2023-06-05 NOTE — Op Note (Signed)
Operative Note    SURGERY DATE: 06/05/2023   PRE-OP DIAGNOSIS:  1. Left medial meniscus tear   POST-OP DIAGNOSIS:  1. Left medial meniscus tear   PROCEDURES:  1.  Left knee arthroscopy, partial medial meniscectomy   SURGEON: Rosealee Albee, MD   ANESTHESIA: Gen   ESTIMATED BLOOD LOSS: minimal   TOTAL IV FLUIDS: per anesthesia   INDICATION(S):  SHIRLA SAILORS is a 72 y.o. female with signs and symptoms as well as MRI finding of medial meniscus tear. She had failed nonoperative management.  After discussion of risks, benefits, and alternatives to surgery, the patient elected to proceed.   OPERATIVE FINDINGS:    Examination under anesthesia: A careful examination under anesthesia was performed.  Passive range of motion was: Hyperextension: 1.  Extension: 0.  Flexion: 130.  Lachman: normal. Pivot Shift: normal.  Posterior drawer: normal.  Varus stability in full extension: normal.  Varus stability in 30 degrees of flexion: normal.  Valgus stability in full extension: normal.  Valgus stability in 30 degrees of flexion: normal.   Intra-operative findings: A thorough arthroscopic examination of the knee was performed.  The findings are: 1. Suprapatellar pouch: Normal 2. Undersurface of median ridge: Grade 1-2 degenerative changes 3. Medial patellar facet: Grade 1 softening 4. Lateral patellar facet: Grade 1 softening 5. Trochlea: Grade 1-2 degenerative changes 6. Lateral gutter/popliteus tendon: Normal 7. Hoffa's fat pad: Inflamed 8. Medial gutter/plica: Normal 9. ACL: Normal 10. PCL: Normal 11. Medial meniscus: Complex tear with primarily horizontal/oblique tear of the undersurface of the posterior horn extending to the posterior horn/body 12. Medial compartment cartilage: Grade 1 degenerative changes to the tibial plateau; normal femoral condyle 13. Lateral meniscus: Normal 14. Lateral compartment cartilage: Grade 2 degenerative changes to the tibial plateau and lateral femoral  condyle   OPERATIVE REPORT:     I identified Lawson E Ndiaye in the pre-operative holding area. I marked the operative knee with my initials. I reviewed the risks and benefits of the proposed surgical intervention and the patient wished to proceed. The patient was transferred to the operative suite and placed in the supine position with all bony prominences padded.  Anesthesia was administered. Appropriate IV antibiotics were administered prior to incision. The extremity was then prepped and draped in standard fashion. A time out was performed confirming the correct extremity, correct patient, and correct procedure.   Arthroscopy portals were marked. Local anesthetic was injected to the planned portal sites. The anterolateral portal was established with an 11 blade.      The arthroscope was placed in the anterolateral portal and then into the suprapatellar pouch. Next, the medial portal was established under needle localization. A diagnostic knee scope was completed with the above findings. The medial meniscus tear was identified.   The MCL was pie-crusted to improve visualization of the posterior horn. The meniscal tear was debrided using an arthroscopic biter and an oscillating shaver until the meniscus had stable borders. Arthroscopic fluid was removed from the joint.   The portals were closed with 3-0 Nylon suture. Sterile dressings included Xeroform, 4x4s, Sof-Rol, and Bias wrap. A Polarcare was placed.  The patient was then awakened and taken to the PACU hemodynamically stable without complication.   POSTOPERATIVE PLAN: The patient will be discharged home today once they meet PACU criteria. Aspirin 325 mg daily was prescribed for 2 weeks for DVT prophylaxis.  Physical therapy will start on POD#3-4. Weight-bearing as tolerated. Follow up in 2 weeks per protocol.

## 2023-06-05 NOTE — Discharge Instructions (Addendum)
Arthroscopic Knee Surgery - Partial Meniscectomy   Post-Op Instructions   1. Bracing or crutches: Crutches will be provided at the time of discharge from the surgery center if you do not already have them.   2. Ice: You may be provided with a device [facility]) that allows you to ice the affected area effectively. Otherwise you can ice manually.    3. Driving:  Plan on not driving for at least two weeks. Please note that you are advised NOT to drive while taking narcotic pain medications as you may be impaired and unsafe to drive.   4. Activity: Ankle pumps several times an hour while awake to prevent blood clots. Weight bearing: as tolerated. Use crutches for as needed (usually ~1 week or less) until pain allows you to ambulate without a limp. Bending and straightening the knee is unlimited. Elevate knee above heart level as much as possible for one week. Avoid standing more than 5 minutes (consecutively) for the first week.  Avoid long distance travel for 2 weeks.  5. Medications:  - You have been provided a prescription for narcotic pain medicine. After surgery, take 1-2 narcotic tablets every 4 hours if needed for severe pain.  - You may take up to 3000mg /day of tylenol (acetaminophen). You can take 1000mg  3x/day. Please check your narcotic. If you have acetaminophen in your narcotic (each tablet will be 325mg ), be careful not to exceed a total of 3000mg /day of acetaminophen.  - A prescription for anti-nausea medication will be provided in case the narcotic medicine or anesthesia causes nausea - take 1 tablet every 6 hours only if nauseated.  - Take ibuprofen 800 mg every 8 hours WITH food to reduce post-operative knee swelling. DO NOT STOP IBUPROFEN POST-OP UNTIL INSTRUCTED TO DO SO at first post-op office visit (10-14 days after surgery). However, please discontinue if you have any abdominal discomfort after taking this.  - Take enteric coated aspirin 325 mg once daily for 2 weeks to prevent  blood clots.    6. Bandages: The physical therapist should change the bandages at the first post-op appointment. If needed, the dressing supplies have been provided to you.   7. Physical Therapy: 1-2 times per week for 6 weeks. Therapy typically starts within 1-2 weeks of surgery.   8. Work: May do light duty/desk job in approximately 1-2 weeks when off of narcotics, pain is well-controlled, and swelling has decreased. Labor intensive jobs may require 4-6 weeks to return.    9. Post-Op Appointments: Your first post-op appointment will be with Dr. Allena Katz in approximately 2 weeks time.    If you find that they have not been scheduled please call the Orthopaedic Appointment front desk at [phone].    POLAR CARE INFORMATION  MassAdvertisement.it  How to use [employer] Polar Care [facility]?  YouTube   ShippingScam.co.uk  OPERATING INSTRUCTIONS  Start the product With dry hands, connect the transformer to the electrical connection located on the top of the cooler. Next, plug the transformer into an appropriate electrical outlet. The unit will automatically start running at this point.  To stop the pump, disconnect electrical power.  Unplug to stop the product when not in use. Unplugging the Polar Care unit turns it off. Always unplug immediately after use. Never leave it plugged in while unattended. Remove pad.    FIRST ADD WATER TO FILL LINE, THEN ICE---Replace ice when existing ice is almost melted  1 Discuss Treatment with your Licensed Health Care Practitioner and  Use Only as Prescribed 2 Apply Insulation Barrier & Cold Therapy Pad 3 Check for Moisture 4 Inspect Skin Regularly  Tips and Trouble Shooting Usage Tips 1. Use cubed or chunked ice for optimal performance. 2. It is recommended to drain the Pad between uses. To drain the pad, hold the Pad upright with the hose pointed toward the ground. Depress the black plunger and allow water to drain  out. 3. You may disconnect the Pad from the unit without removing the pad from the affected area by depressing the silver tabs on the hose coupling and gently pulling the hoses apart. The Pad and unit will seal itself and will not leak. Note: Some dripping during release is normal. 4. DO NOT RUN PUMP WITHOUT WATER! The pump in this unit is designed to run with water. Running the unit without water will cause permanent damage to the pump. 5. Unplug unit before removing lid.  TROUBLESHOOTING GUIDE Pump not running, Water not flowing to the pad, Pad is not getting cold 1. Make sure the transformer is plugged into the wall outlet. 2. Confirm that the ice and water are filled to the indicated levels. 3. Make sure there are no kinks in the pad. 4. Gently pull on the blue tube to make sure the tube/pad junction is straight. 5. Remove the pad from the treatment site and ll it while the pad is lying at; then reapply. 6. Confirm that the pad couplings are securely attached to the unit. Listen for the double clicks (Figure 1) to confirm the pad couplings are securely attached.  Leaks    Note: Some condensation on the lines, controller, and pads is unavoidable, especially in warmer climates. 1. If using a [employer] Polar Care Cold Therapy unit with a detachable Cold Therapy Pad, and a leak exists (other than condensation on the lines) disconnect the pad couplings. Make sure the silver tabs on the couplings are depressed before reconnecting the pad to the pump hose; then confirm both sides of the coupling are properly clicked in. 2. If the coupling continues to leak or a leak is detected in the pad itself, stop using it and call [employer] Customer Care at [phone].  Cleaning After use, empty and dry the unit with a soft cloth. Warm water and mild detergent may be used occasionally to clean the pump and tubes.  WARNING: The Polar Care Cube can be cold enough to cause serious injury, including full skin necrosis.  Follow these Operating Instructions, and carefully read the Product Insert (see pouch on side of unit) and the Cold Therapy Pad Fitting Instructions (provided with each Cold Therapy Pad) prior to use.

## 2023-06-05 NOTE — Anesthesia Postprocedure Evaluation (Signed)
Anesthesia Post Note  Patient: Shannon Petersen  Procedure(s) Performed: Left knee arthroscopic medial meniscectomy (Left: Knee)  Patient location during evaluation: PACU Anesthesia Type: General Level of consciousness: awake and alert Pain management: pain level controlled Vital Signs Assessment: post-procedure vital signs reviewed and stable Respiratory status: spontaneous breathing, nonlabored ventilation, respiratory function stable and patient connected to nasal cannula oxygen Cardiovascular status: blood pressure returned to baseline and stable Postop Assessment: no apparent nausea or vomiting Anesthetic complications: no   No notable events documented.   Last Vitals:  Vitals:   06/05/23 1431 06/05/23 1446  BP: 122/67 119/76  Pulse: (!) 57 62  Resp: 11 12  Temp:    SpO2: 98% 97%    Last Pain:  Vitals:   06/05/23 1446  TempSrc:   PainSc: 0-No pain                 Lenard Simmer

## 2023-06-05 NOTE — Anesthesia Procedure Notes (Signed)
Procedure Name: LMA Insertion Date/Time: 06/05/2023 1:22 PM  Performed by: Irving Burton, CRNAPre-anesthesia Checklist: Patient identified, Patient being monitored, Timeout performed, Emergency Drugs available and Suction available Patient Re-evaluated:Patient Re-evaluated prior to induction Oxygen Delivery Method: Circle system utilized Preoxygenation: Pre-oxygenation with 100% oxygen Induction Type: IV induction Ventilation: Mask ventilation without difficulty LMA: LMA inserted LMA Size: 4.0 Tube type: Oral Number of attempts: 1 Placement Confirmation: positive ETCO2 and breath sounds checked- equal and bilateral Tube secured with: Tape Dental Injury: Teeth and Oropharynx as per pre-operative assessment

## 2023-06-05 NOTE — Transfer of Care (Signed)
Immediate Anesthesia Transfer of Care Note  Patient: Shannon Petersen  Procedure(s) Performed: Left knee arthroscopic medial meniscectomy (Left: Knee)  Patient Location: PACU  Anesthesia Type: General  Level of Consciousness: awake, alert  and patient cooperative  Airway and Oxygen Therapy: Patient Spontanous Breathing and Patient connected to supplemental oxygen  Post-op Assessment: Post-op Vital signs reviewed, Patient's Cardiovascular Status Stable, Respiratory Function Stable, Patent Airway and No signs of Nausea or vomiting  Post-op Vital Signs: Reviewed and stable  Complications: No notable events documented.

## 2023-06-05 NOTE — H&P (Signed)
Paper H&P to be scanned into permanent record. H&P reviewed. No significant changes noted.  

## 2023-06-05 NOTE — Anesthesia Preprocedure Evaluation (Addendum)
Anesthesia Evaluation  Patient identified by MRN, date of birth, ID band Patient awake    Reviewed: Allergy & Precautions, NPO status , Patient's Chart, lab work & pertinent test results  History of Anesthesia Complications (+) PONV and history of anesthetic complications  Airway Mallampati: IV  TM Distance: >3 FB Neck ROM: Full    Dental  (+) Dental Advidsory Given, Caps, Missing   Pulmonary neg pulmonary ROS   Pulmonary exam normal        Cardiovascular hypertension, Pt. on medications (-) angina (-) Past MI and (-) Cardiac Stents Normal cardiovascular exam(-) dysrhythmias (-) Valvular Problems/Murmurs     Neuro/Psych negative neurological ROS  negative psych ROS   GI/Hepatic ,GERD  Medicated and Controlled,,NAFLD   Endo/Other  negative endocrine ROS    Renal/GU negative Renal ROS  negative genitourinary   Musculoskeletal   Abdominal Normal abdominal exam  (+)   Peds  Hematology negative hematology ROS (+)   Anesthesia Other Findings Past Medical History: No date: Acid reflux No date: Herniated disc, cervical No date: Hypertension 2012: Pericarditis No date: PONV (postoperative nausea and vomiting) No date: Restless leg No date: Wears contact lenses No date: Wrist disorder     Comment:  right - damaged tendon.  limited use  Past Surgical History: No date: ABDOMINAL HYSTERECTOMY 01/17/2017: CATARACT EXTRACTION W/PHACO; Left     Comment:  Procedure: CATARACT EXTRACTION PHACO AND INTRAOCULAR               LENS PLACEMENT (IOC) left symfony lens;  Surgeon: Nevada Crane, MD;  Location: [facility];  Service:               Ophthalmology;  Laterality: Left;  symfony lens 02/14/2017: CATARACT EXTRACTION W/PHACO; Right     Comment:  Procedure: CATARACT EXTRACTION PHACO AND INTRAOCULAR               LENS PLACEMENT (IOC)  symfony lens right;  Surgeon: Nevada Crane, MD;   Location: [facility];                Service: Ophthalmology;  Laterality: Right; No date: ELBOW SURGERY; Right     Comment:  topaz No date: EYE SURGERY No date: TUBAL LIGATION No date: WRIST SURGERY; Right     Comment:  tendon damage     Reproductive/Obstetrics negative OB ROS                             Anesthesia Physical Anesthesia Plan  ASA: 3  Anesthesia Plan: General   Post-op Pain Management:    Induction: Intravenous  PONV Risk Score and Plan: 4 or greater and Propofol infusion and TIVA  Airway Management Planned: LMA  Additional Equipment:   Intra-op Plan:   Post-operative Plan: Extubation in OR  Informed Consent: I have reviewed the patients History and Physical, chart, labs and discussed the procedure including the risks, benefits and alternatives for the proposed anesthesia with the patient or authorized representative who has indicated his/her understanding and acceptance.     Dental advisory given  Plan Discussed with: CRNA  Anesthesia Plan Comments:         Anesthesia Quick Evaluation

## 2023-06-07 ENCOUNTER — Encounter: Payer: Self-pay | Admitting: Orthopedic Surgery

## 2023-09-25 ENCOUNTER — Ambulatory Visit
Admission: EM | Admit: 2023-09-25 | Discharge: 2023-09-25 | Disposition: A | Discharge status: Home / Self Care | Payer: Medicare PPO | Attending: Emergency Medicine | Admitting: Emergency Medicine

## 2023-09-25 DIAGNOSIS — R21 Rash and other nonspecific skin eruption: Secondary | ICD-10-CM | POA: Diagnosis not present

## 2023-09-25 MED ORDER — PERMETHRIN 5 % EX CREA
TOPICAL_CREAM | CUTANEOUS | 0 refills | Status: AC
Start: 2023-09-25 — End: ?

## 2023-09-25 MED ORDER — PREDNISONE 50 MG PO TABS
60.0000 mg | ORAL_TABLET | Freq: Once | ORAL | Status: AC
  Administered 2023-09-25: 60 mg via ORAL

## 2023-09-25 MED ORDER — PREDNISONE 10 MG (21) PO TBPK: ORAL_TABLET | ORAL | 0 refills | Status: DC

## 2023-09-25 MED ORDER — PREDNISONE 10 MG PO TABS: 10.0000 mg | ORAL_TABLET | Freq: Every day | ORAL | 0 refills | Status: AC

## 2023-09-25 MED ORDER — HYDROXYZINE HCL 25 MG PO TABS
25.0000 mg | ORAL_TABLET | Freq: Four times a day (QID) | ORAL | 0 refills | Status: AC | PRN
Start: 2023-09-25 — End: ?

## 2023-09-25 NOTE — Discharge Instructions (Addendum)
I am giving you 60 mg of prednisone tonight.  This should help with the itching.  You can take 50 mg of prednisone tomorrow, followed by 40 mg the next day, 30 mg the next day, 20 mg the day after that, then 10 mg.  Claritin or Zyrtec for itching, and if that does not work, then Atarax.  I would start with half a pill of Atarax.  Follow-up with your dermatologist if this does not clear your symptoms.  I am unsure if this could be fleas or scabies.  Scabies is usually transmitted through close physical contact, but may be transmitted through clothing, linens, or towels. Apply the permethrin from head to soles of feet at bedtime, leave on for 8-14 hours. Mites tend to persist under the fingernails. Trim them, scrub beneath them, and apply the permetrhin under your nails. Repeat 1 week later. You will also need to treat family members, frequent household guests, close physical or sexual contacts, even if they have no symptoms. Wash all clothing, bedding, and towels in hot water, dry cleaned, or placed through the heat cycle of a dryer to prevent reinfection. Or you can place all bedding and clothing that might be infected in sealed plastic bags for 72 hours.  Make sure you thoroughly clean the infected person's room. The itching will not go away immediately. Continued itching does not mean that the treatment was ineffective. Dead mites and eggs will cause an immune response but will eventually go away as the skin turns over.   Go to www.goodrx.com  or www.costplusdrugs.com to look up your medications. This will give you a list of where you can find your prescriptions at the most affordable prices. Or ask the pharmacist what the cash price is, or if they have any other discount programs available to help make your medication more affordable. This can be less expensive than what you would pay with insurance.

## 2023-09-25 NOTE — ED Triage Notes (Signed)
Hives for 1.5 weeks. Patient is using cream cerave and prednisone that she finished a week ago.

## 2024-01-05 ENCOUNTER — Other Ambulatory Visit: Payer: Self-pay

## 2024-01-05 ENCOUNTER — Emergency Department

## 2024-01-05 ENCOUNTER — Emergency Department
Admission: EM | Admit: 2024-01-05 | Discharge: 2024-01-05 | Disposition: A | Discharge status: Home / Self Care | Attending: Emergency Medicine | Admitting: Emergency Medicine

## 2024-01-05 DIAGNOSIS — D72829 Elevated white blood cell count, unspecified: Secondary | ICD-10-CM | POA: Diagnosis not present

## 2024-01-05 DIAGNOSIS — R1084 Generalized abdominal pain: Secondary | ICD-10-CM

## 2024-01-05 DIAGNOSIS — J189 Pneumonia, unspecified organism: Secondary | ICD-10-CM

## 2024-01-05 DIAGNOSIS — K5900 Constipation, unspecified: Secondary | ICD-10-CM | POA: Diagnosis not present

## 2024-01-05 LAB — URINALYSIS, ROUTINE W REFLEX MICROSCOPIC
Bilirubin Urine: NEGATIVE
Glucose, UA: NEGATIVE mg/dL
Hgb urine dipstick: NEGATIVE
Ketones, ur: NEGATIVE mg/dL
Leukocytes,Ua: NEGATIVE
Nitrite: NEGATIVE
Protein, ur: NEGATIVE mg/dL
Specific Gravity, Urine: 1.013 (ref 1.005–1.030)
pH: 8 (ref 5.0–8.0)

## 2024-01-05 LAB — CBC
HCT: 41.3 % (ref 36.0–46.0)
Hemoglobin: 13.7 g/dL (ref 12.0–15.0)
MCH: 29.3 pg (ref 26.0–34.0)
MCHC: 33.2 g/dL (ref 30.0–36.0)
MCV: 88.4 fL (ref 80.0–100.0)
Platelets: 203 10*3/uL (ref 150–400)
RBC: 4.67 MIL/uL (ref 3.87–5.11)
RDW: 14.7 % (ref 11.5–15.5)
WBC: 21.4 10*3/uL — ABNORMAL HIGH (ref 4.0–10.5)
nRBC: 0 % (ref 0.0–0.2)

## 2024-01-05 LAB — COMPREHENSIVE METABOLIC PANEL WITH GFR
ALT: 20 U/L (ref 0–44)
AST: 24 U/L (ref 15–41)
Albumin: 3.9 g/dL (ref 3.5–5.0)
Alkaline Phosphatase: 54 U/L (ref 38–126)
Anion gap: 9 (ref 5–15)
BUN: 17 mg/dL (ref 8–23)
CO2: 29 mmol/L (ref 22–32)
Calcium: 8.7 mg/dL — ABNORMAL LOW (ref 8.9–10.3)
Chloride: 98 mmol/L (ref 98–111)
Creatinine, Ser: 0.82 mg/dL (ref 0.44–1.00)
GFR, Estimated: >60 mL/min (ref >60)
Glucose, Bld: 124 mg/dL — ABNORMAL HIGH (ref 70–99)
Potassium: 3.8 mmol/L (ref 3.5–5.1)
Sodium: 136 mmol/L (ref 135–145)
Total Bilirubin: 1 mg/dL (ref 0.0–1.2)
Total Protein: 6.8 g/dL (ref 6.5–8.1)

## 2024-01-05 LAB — LIPASE, BLOOD: Lipase: 27 U/L (ref 11–51)

## 2024-01-05 MED ORDER — SENNOSIDES-DOCUSATE SODIUM 8.6-50 MG PO TABS: 1.0000 | ORAL_TABLET | Freq: Every day | ORAL | 0 refills | Status: AC

## 2024-01-05 MED ORDER — AMOXICILLIN-POT CLAVULANATE 875-125 MG PO TABS: 1.0000 | ORAL_TABLET | Freq: Two times a day (BID) | ORAL | 0 refills | Status: AC

## 2024-01-05 MED ORDER — MORPHINE SULFATE (PF) 4 MG/ML IV SOLN
4.0000 mg | Freq: Once | INTRAVENOUS | Status: AC
  Administered 2024-01-05: 4 mg via INTRAVENOUS
  Filled 2024-01-05: qty 1

## 2024-01-05 MED ORDER — MAGNESIUM CITRATE PO SOLN: 1.0000 | Freq: Every day | ORAL | 0 refills | Status: AC

## 2024-01-05 MED ORDER — ONDANSETRON HCL 4 MG/2ML IJ SOLN
4.0000 mg | Freq: Once | INTRAMUSCULAR | Status: AC
  Administered 2024-01-05: 4 mg via INTRAVENOUS
  Filled 2024-01-05: qty 2

## 2024-01-05 MED ORDER — IOHEXOL 300 MG/ML  SOLN
100.0000 mL | Freq: Once | INTRAMUSCULAR | Status: AC | PRN
  Administered 2024-01-05: 100 mL via INTRAVENOUS

## 2024-01-05 MED ORDER — AMOXICILLIN-POT CLAVULANATE 875-125 MG PO TABS
1.0000 | ORAL_TABLET | Freq: Once | ORAL | Status: AC
  Administered 2024-01-05: 1 via ORAL
  Filled 2024-01-05: qty 1

## 2024-01-05 MED ORDER — SODIUM CHLORIDE 0.9 % IV BOLUS
1000.0000 mL | Freq: Once | INTRAVENOUS | Status: AC
  Administered 2024-01-05: 1000 mL via INTRAVENOUS

## 2024-01-05 NOTE — ED Notes (Signed)
 EDP at [facility]

## 2024-01-05 NOTE — ED Provider Notes (Signed)
 [facility] Provider Note    Event Date/Time   First MD Initiated Contact with Patient 01/05/24 1340     (approximate)   History   Abdominal Pain   HPI  Shannon Petersen is a 73 y.o. female past medical history significant for acid reflux who presents to the emergency department with abdominal pain.  Abdominal pain that is a diffuse pain.  States that she has been having problems with constipation for the past couple of weeks.  Significant abdominal pain with abdominal cramping with nausea and vomiting that worsened last night.  Last bowel movement was approximately 2 weeks ago.  Passing flatus.  No prior abdominal surgery.  Weight gain of approximately 8 pounds.  Denies fever, chills, dysuria, urinary urgency or frequency.  Has tried MiraLAX and multiple suppositories without any relief.  Prior colonoscopy this been unremarkable.  Prior endoscopy in the past.     Physical Exam   Triage Vital Signs: ED Triage Vitals  Encounter Vitals Group     BP 01/05/24 1147 106/88     Systolic BP Percentile --      Diastolic BP Percentile --      Pulse Rate 01/05/24 1147 99     Resp 01/05/24 1147 16     Temp 01/05/24 1147 99.1 F (37.3 C)     Temp Source 01/05/24 1147 Oral     SpO2 01/05/24 1147 96 %     Weight 01/05/24 1148 143 lb (64.9 kg)     Height 01/05/24 1148 4\' 11"  (1.499 m)     Head Circumference --      Peak Flow --      Pain Score 01/05/24 1147 8     Pain Loc --      Pain Education --      Exclude from Growth Chart --     Most recent vital signs: Vitals:   01/05/24 1147 01/05/24 1416  BP: 106/88 114/63  Pulse: 99 84  Resp: 16   Temp: 99.1 F (37.3 C)   SpO2: 96% 96%    Physical Exam Exam conducted with a chaperone present.  Constitutional:      Appearance: She is well-developed.  HENT:     Head: Atraumatic.  Eyes:     Conjunctiva/sclera: Conjunctivae normal.  Cardiovascular:     Rate and Rhythm: Regular rhythm.  Pulmonary:      Effort: No respiratory distress.  Abdominal:     General: There is no distension.     Tenderness: There is generalized abdominal tenderness.  Genitourinary:    Rectum: Normal.     Comments: No impaction.  No gross blood or melena.  No palpable mass Musculoskeletal:        General: Normal range of motion.     Cervical back: Normal range of motion.  Skin:    General: Skin is warm.  Neurological:     Mental Status: She is alert. Mental status is at baseline.     IMPRESSION / MDM / ASSESSMENT AND PLAN / ED COURSE  I reviewed the triage vital signs and the nursing notes.  Differential diagnosis including SBO, malignancy, thyroid disorder, dehydration, acute appendicitis  EKG  I, Corena Herter, the attending physician, personally viewed and interpreted this ECG.   Rate: Normal  Rhythm: Normal sinus  Axis: Normal  Intervals: Normal  ST&T Change: None  No tachycardic or bradycardic dysrhythmias while on cardiac telemetry.  RADIOLOGY I independently reviewed imaging, my interpretation of imaging: CT  scan abdomen and pelvis -findings of constipation with no obvious transition point.  CT scan read is currently pending.  LABS (all labs ordered are listed, but only abnormal results are displayed) Labs interpreted as -    Labs Reviewed  COMPREHENSIVE METABOLIC PANEL WITH GFR - Abnormal; Notable for the following components:      Result Value   Glucose, Bld 124 (*)    Calcium 8.7 (*)    All other components within normal limits  CBC - Abnormal; Notable for the following components:   WBC 21.4 (*)    All other components within normal limits  URINALYSIS, ROUTINE W REFLEX MICROSCOPIC - Abnormal; Notable for the following components:   Color, Urine YELLOW (*)    APPearance CLEAR (*)    All other components within normal limits  LIPASE, BLOOD     MDM  Significant leukocytosis of 21.  Creatinine appears at baseline.  No significant electrolyte abnormality.  Lipase within  normal limits.  No findings of urinary tract infection.  No signs of impaction on exam.  CT scan currently pending.  Care transferred to incoming provider Dr. Anner Crete.  See their note for reevaluation and further treatment.     PROCEDURES:  Critical Care performed: No  Procedures  Patient's presentation is most consistent with acute presentation with potential threat to life or bodily function.   MEDICATIONS ORDERED IN ED: Medications  morphine (PF) 4 MG/ML injection 4 mg (4 mg Intravenous Given 01/05/24 1416)  ondansetron (ZOFRAN) injection 4 mg (4 mg Intravenous Given 01/05/24 1414)  sodium chloride 0.9 % bolus 1,000 mL (1,000 mLs Intravenous New Bag/Given 01/05/24 1413)  iohexol (OMNIPAQUE) 300 MG/ML solution 100 mL (100 mLs Intravenous Contrast Given 01/05/24 1435)    FINAL CLINICAL IMPRESSION(S) / ED DIAGNOSES   Final diagnoses:  Generalized abdominal pain  Constipation, unspecified constipation type     Rx / DC Orders   ED Discharge Orders     None        Note:  This document was prepared using Dragon voice recognition software and may include unintentional dictation errors.   Corena Herter, MD 01/05/24 1517

## 2024-01-05 NOTE — ED Triage Notes (Signed)
 Pt to ED from [facility] for c/o abd pain for a week, states she has not had a bowel movement for two weeks, used suppository yesterday without results. Pt states she has had emesis.

## 2024-01-05 NOTE — ED Notes (Signed)
 First Nurse Note: Pt to ED via [facility] for constipation, abdominal distention, vomiting, and generalized weakness.

## 2024-01-05 NOTE — Discharge Instructions (Signed)
 I have sent antibiotics to your pharmacy to help treat the right-sided pneumonia that you have.  Please return to the emergency department if you have any significant worsening shortness of breath.  I have also sent a couple medications that should hopefully help with your constipation.  Please follow-up with your primary care provider for this.

## 2024-01-05 NOTE — ED Provider Notes (Signed)
  Physical Exam  BP 107/62   Pulse 81   Temp 99.1 F (37.3 C) (Oral)   Resp 16   Ht 4\' 11"  (1.499 m)   Wt 64.9 kg   SpO2 97%   BMI 28.88 kg/m   Physical Exam  Procedures  Procedures  ED Course / MDM    Medical Decision Making Amount and/or Complexity of Data Reviewed Labs: ordered. Radiology: ordered.  Risk OTC drugs. Prescription drug management.   Received patient in signout.  73 year old female presenting today for diffuse abdominal pain.  Does have a history of constipation and unsure if this is related to her symptoms.  Laboratory workup notable for leukocytosis but otherwise reassuring.  Signed out pending CT imaging of abdomen/pelvis.  CT imaging of abdomen/pelvis shows no evidence of any bowel obstruction or acute intra-abdominal infection.  Noticeable constipation present.  Separately I did catch signs of possible right-sided pneumonia.  I discussed this with patient and she stated she has noted a little bit of a cough recently but no significant shortness of breath.  She is not hypoxic or tachypneic and no tachycardia.  Will empirically treat with Augmentin and discharged on a bowel regimen.  Considered admission but vital signs otherwise stable and no acute respiratory concerns from a pneumonia perspective.  Told to follow-up with PCP and given strict return precautions.     Janith Lima, MD 01/05/24 [phone]

## 2024-07-22 ENCOUNTER — Other Ambulatory Visit: Payer: Self-pay | Admitting: Gastroenterology

## 2024-07-22 DIAGNOSIS — R1310 Dysphagia, unspecified: Secondary | ICD-10-CM

## 2024-07-22 DIAGNOSIS — K219 Gastro-esophageal reflux disease without esophagitis: Secondary | ICD-10-CM

## 2024-07-22 DIAGNOSIS — K5900 Constipation, unspecified: Secondary | ICD-10-CM

## 2024-07-25 ENCOUNTER — Ambulatory Visit
Admission: RE | Admit: 2024-07-25 | Discharge: 2024-07-25 | Disposition: A | Discharge status: Home / Self Care | Source: Ambulatory Visit | Attending: Gastroenterology | Admitting: Gastroenterology

## 2024-07-25 DIAGNOSIS — K219 Gastro-esophageal reflux disease without esophagitis: Secondary | ICD-10-CM | POA: Diagnosis present

## 2024-07-25 DIAGNOSIS — R1310 Dysphagia, unspecified: Secondary | ICD-10-CM | POA: Insufficient documentation

## 2024-07-25 DIAGNOSIS — K5900 Constipation, unspecified: Secondary | ICD-10-CM | POA: Insufficient documentation

## 2024-07-30 ENCOUNTER — Encounter: Payer: Self-pay | Admitting: *Deleted

## 2024-08-13 ENCOUNTER — Ambulatory Visit: Admitting: Anesthesiology

## 2024-08-13 ENCOUNTER — Ambulatory Visit
Admission: RE | Admit: 2024-08-13 | Discharge: 2024-08-13 | Disposition: A | Discharge status: Home / Self Care | Attending: Gastroenterology | Admitting: Gastroenterology

## 2024-08-13 ENCOUNTER — Encounter
Admission: RE | Disposition: A | Discharge status: Home / Self Care | Payer: Self-pay | Source: Home / Self Care | Attending: Gastroenterology

## 2024-08-13 DIAGNOSIS — F419 Anxiety disorder, unspecified: Secondary | ICD-10-CM | POA: Diagnosis not present

## 2024-08-13 DIAGNOSIS — K6389 Other specified diseases of intestine: Secondary | ICD-10-CM | POA: Diagnosis not present

## 2024-08-13 DIAGNOSIS — E785 Hyperlipidemia, unspecified: Secondary | ICD-10-CM | POA: Diagnosis not present

## 2024-08-13 DIAGNOSIS — Z7952 Long term (current) use of systemic steroids: Secondary | ICD-10-CM | POA: Diagnosis not present

## 2024-08-13 DIAGNOSIS — K625 Hemorrhage of anus and rectum: Secondary | ICD-10-CM | POA: Diagnosis present

## 2024-08-13 DIAGNOSIS — K219 Gastro-esophageal reflux disease without esophagitis: Secondary | ICD-10-CM | POA: Diagnosis not present

## 2024-08-13 DIAGNOSIS — K640 First degree hemorrhoids: Secondary | ICD-10-CM | POA: Diagnosis not present

## 2024-08-13 DIAGNOSIS — K5900 Constipation, unspecified: Secondary | ICD-10-CM | POA: Diagnosis not present

## 2024-08-13 DIAGNOSIS — I10 Essential (primary) hypertension: Secondary | ICD-10-CM | POA: Diagnosis not present

## 2024-08-13 DIAGNOSIS — R1310 Dysphagia, unspecified: Secondary | ICD-10-CM | POA: Insufficient documentation

## 2024-08-13 DIAGNOSIS — K5730 Diverticulosis of large intestine without perforation or abscess without bleeding: Secondary | ICD-10-CM | POA: Insufficient documentation

## 2024-08-13 DIAGNOSIS — K760 Fatty (change of) liver, not elsewhere classified: Secondary | ICD-10-CM | POA: Insufficient documentation

## 2024-08-13 DIAGNOSIS — Z79899 Other long term (current) drug therapy: Secondary | ICD-10-CM | POA: Insufficient documentation

## 2024-08-13 HISTORY — DX: Anxiety disorder, unspecified: F41.9

## 2024-08-13 HISTORY — DX: Unspecified malignant neoplasm of skin, unspecified: C44.90

## 2024-08-13 HISTORY — PX: COLONOSCOPY: SHX5424

## 2024-08-13 HISTORY — DX: Other specified disorders of bone density and structure, unspecified site: M85.80

## 2024-08-13 HISTORY — DX: Cervicalgia: M54.2

## 2024-08-13 HISTORY — PX: ESOPHAGOGASTRODUODENOSCOPY: SHX5428

## 2024-08-13 HISTORY — DX: Constipation, unspecified: K59.00

## 2024-08-13 HISTORY — DX: Other intervertebral disc degeneration, lumbar region without mention of lumbar back pain or lower extremity pain: M51.369

## 2024-08-13 HISTORY — DX: Cerebral infarction, unspecified: I63.9

## 2024-08-13 HISTORY — DX: Insomnia, unspecified: G47.00

## 2024-08-13 HISTORY — DX: Personal history of colon polyps, unspecified: Z86.0100

## 2024-08-13 SURGERY — COLONOSCOPY
Anesthesia: General

## 2024-08-13 MED ORDER — LIDOCAINE HCL (CARDIAC) PF 100 MG/5ML IV SOSY
PREFILLED_SYRINGE | INTRAVENOUS | Status: DC | PRN
  Administered 2024-08-13: 90 mg via INTRAVENOUS

## 2024-08-13 MED ORDER — PROPOFOL 500 MG/50ML IV EMUL
INTRAVENOUS | Status: DC | PRN
Start: 2024-08-13 — End: 2024-08-13
  Administered 2024-08-13: 200 ug/kg/min via INTRAVENOUS

## 2024-08-13 MED ORDER — SODIUM CHLORIDE 0.9 % IV SOLN
INTRAVENOUS | Status: DC
  Administered 2024-08-13: 1000 mL via INTRAVENOUS

## 2024-08-13 MED ORDER — PROPOFOL 10 MG/ML IV BOLUS
INTRAVENOUS | Status: DC | PRN
  Administered 2024-08-13: 100 mg via INTRAVENOUS

## 2024-08-13 NOTE — Anesthesia Preprocedure Evaluation (Addendum)
 Anesthesia Evaluation  Patient identified by MRN, date of birth, ID band Patient awake    Reviewed: Allergy & Precautions, NPO status , Patient's Chart, lab work & pertinent test results  History of Anesthesia Complications (+) PONV and history of anesthetic complications  Airway Mallampati: III  TM Distance: >3 FB Neck ROM: Full    Dental  (+) Dental Advidsory Given, Caps, Missing   Pulmonary neg pulmonary ROS   Pulmonary exam normal        Cardiovascular hypertension, Pt. on medications (-) angina (-) Past MI and (-) Cardiac Stents Normal cardiovascular exam(-) dysrhythmias (-) Valvular Problems/Murmurs     Neuro/Psych   Anxiety     negative neurological ROS  negative psych ROS   GI/Hepatic ,GERD  Medicated and Controlled,,NAFLD   Endo/Other  negative endocrine ROS    Renal/GU negative Renal ROS  negative genitourinary   Musculoskeletal   Abdominal Normal abdominal exam  (+)   Peds  Hematology negative hematology ROS (+)   Anesthesia Other Findings   Past Surgical History: No date: ABDOMINAL HYSTERECTOMY 01/17/2017: CATARACT EXTRACTION W/PHACO; Left     Comment:  Procedure: CATARACT EXTRACTION PHACO AND INTRAOCULAR               LENS PLACEMENT (IOC) left symfony lens;  Surgeon: Adine Oneil Novak, MD;  Location: [facility];  Service:               Ophthalmology;  Laterality: Left;  symfony lens 02/14/2017: CATARACT EXTRACTION W/PHACO; Right     Comment:  Procedure: CATARACT EXTRACTION PHACO AND INTRAOCULAR               LENS PLACEMENT (IOC)  symfony lens right;  Surgeon: Novak Adine Oneil, MD;  Location: [facility];                Service: Ophthalmology;  Laterality: Right; No date: ELBOW SURGERY; Right     Comment:  topaz No date: EYE SURGERY No date: TUBAL LIGATION No date: WRIST SURGERY; Right     Comment:  tendon damage      Reproductive/Obstetrics negative OB ROS                              Anesthesia Physical Anesthesia Plan  ASA: 2  Anesthesia Plan: General   Post-op Pain Management:    Induction: Intravenous  PONV Risk Score and Plan: 4 or greater and Propofol  infusion and TIVA  Airway Management Planned: Natural Airway and Nasal Cannula  Additional Equipment:   Intra-op Plan:   Post-operative Plan:   Informed Consent: I have reviewed the patients History and Physical, chart, labs and discussed the procedure including the risks, benefits and alternatives for the proposed anesthesia with the patient or authorized representative who has indicated his/her understanding and acceptance.     Dental advisory given  Plan Discussed with: CRNA  Anesthesia Plan Comments: (Ivga with LMA as backup)         Anesthesia Quick Evaluation

## 2024-08-13 NOTE — Transfer of Care (Signed)
 Immediate Anesthesia Transfer of Care Note  Patient: Shannon Petersen  Procedure(s) Performed: COLONOSCOPY EGD (ESOPHAGOGASTRODUODENOSCOPY)  Patient Location: Endoscopy Unit  Anesthesia Type:General  Level of Consciousness: awake and alert   Airway & Oxygen Therapy: Patient Spontanous Breathing  Post-op Assessment: Report given to RN and Post -op Vital signs reviewed and stable  Post vital signs: Reviewed and stable  Last Vitals:  Vitals Value Taken Time  BP 105/68 08/13/24 11:00  Temp 36.4 C 08/13/24 10:59  Pulse 72 08/13/24 11:01  Resp 28 08/13/24 11:01  SpO2 100 % 08/13/24 11:01  Vitals shown include unfiled device data.  Last Pain:  Vitals:   08/13/24 1059  TempSrc: Temporal  PainSc:          Complications: No notable events documented.

## 2024-08-13 NOTE — Interval H&P Note (Signed)
 History and Physical Interval Note:  08/13/2024 10:28 AM  Shannon Petersen  has presented today for surgery, with the diagnosis of gerd dysphagia constipation.  The various methods of treatment have been discussed with the patient and family. After consideration of risks, benefits and other options for treatment, the patient has consented to  Procedure(s): COLONOSCOPY (N/A) EGD (ESOPHAGOGASTRODUODENOSCOPY) (N/A) as a surgical intervention.  The patient's history has been reviewed, patient examined, no change in status, stable for surgery.  I have reviewed the patient's chart and labs.  Questions were answered to the patient's satisfaction.     Ole ONEIDA Schick  Ok to proceed with EGD/Colonoscopy

## 2024-08-13 NOTE — Op Note (Signed)
 [facility] Gastroenterology Patient Name: Shannon Petersen Procedure Date: 08/13/2024 10:17 AM MRN: [id number] Account #: [id number] Date of Birth: 01/01/1951 Admit Type: Outpatient Age: 73 Room: [facility] ENDO ROOM 1 Gender: Female Note Status: Finalized Instrument Name: Endoscope [id number] Procedure:             Upper GI endoscopy Indications:           Dysphagia, Gastro-esophageal reflux disease Providers:             Ole Schick MD, MD Referring MD:          Jamee Grip MD (Referring MD) Medicines:             Monitored Anesthesia Care Complications:         No immediate complications. Estimated blood loss:                         Minimal. Procedure:             Pre-Anesthesia Assessment:                        - Prior to the procedure, a History and Physical was                         performed, and patient medications and allergies were                         reviewed. The patient is competent. The risks and                         benefits of the procedure and the sedation options and                         risks were discussed with the patient. All questions                         were answered and informed consent was obtained.                         Patient identification and proposed procedure were                         verified by the physician, the nurse, the                         anesthesiologist, the anesthetist and the technician                         in the endoscopy suite. Mental Status Examination:                         alert and oriented. Airway Examination: normal                         oropharyngeal airway and neck mobility. Respiratory                         Examination: clear to auscultation. CV Examination:  normal. Prophylactic Antibiotics: The patient does not                         require prophylactic antibiotics. Prior                         Anticoagulants: The patient has taken no anticoagulant                          or antiplatelet agents. ASA Grade Assessment: II - A                         patient with mild systemic disease. After reviewing                         the risks and benefits, the patient was deemed in                         satisfactory condition to undergo the procedure. The                         anesthesia plan was to use monitored anesthesia care                         (MAC). Immediately prior to administration of                         medications, the patient was re-assessed for adequacy                         to receive sedatives. The heart rate, respiratory                         rate, oxygen saturations, blood pressure, adequacy of                         pulmonary ventilation, and response to care were                         monitored throughout the procedure. The physical                         status of the patient was re-assessed after the                         procedure.                        After obtaining informed consent, the endoscope was                         passed under direct vision. Throughout the procedure,                         the patient's blood pressure, pulse, and oxygen                         saturations were monitored continuously. The Endoscope  was introduced through the mouth, and advanced to the                         second part of duodenum. The upper GI endoscopy was                         accomplished without difficulty. The patient tolerated                         the procedure well. Findings:      The examined esophagus was normal. Biopsies were obtained from the       proximal and distal esophagus with cold forceps for histology of       suspected eosinophilic esophagitis. Estimated blood loss was minimal.      The entire examined stomach was normal.      The examined duodenum was normal. Impression:            - Normal esophagus.                        - Normal stomach.                         - Normal examined duodenum.                        - Biopsies were taken with a cold forceps for                         evaluation of eosinophilic esophagitis. Recommendation:        - Discharge patient to home.                        - Resume previous diet.                        - Continue present medications.                        - Await pathology results.                        - Return to referring physician as previously                         scheduled. Procedure Code(s):     --- Professional ---                        [phone], Esophagogastroduodenoscopy, flexible,                         transoral; with biopsy, single or multiple Diagnosis Code(s):     --- Professional ---                        R13.10, Dysphagia, unspecified                        K21.9, Gastro-esophageal reflux disease without                         esophagitis CPT copyright 2022  American Medical Association. All rights reserved. The codes documented in this report are preliminary and upon coder review may  be revised to meet current compliance requirements. Ole Schick MD, MD 08/13/2024 10:58:00 AM Number of Addenda: 0 Note Initiated On: 08/13/2024 10:17 AM Estimated Blood Loss:  Estimated blood loss was minimal.      [facility]

## 2024-08-13 NOTE — H&P (Signed)
 Outpatient short stay form Pre-procedure 08/13/2024  Shannon ONEIDA Schick, MD  Primary Physician: Derenda Rockers, MD  Reason for visit:  GERD/Dysphagia/Rectal bleeding/Constipation  History of present illness:    73 y/o lady with history of hypertension, constipation, and arthritis here for EGD/Colonoscopy for GERD/Dysphagia/rectal bleeding/constipation. Last colonoscopy in 2023 was unremarkable. History of hysterectomy. No blood thinners. No family history of GI malignancies.    Current Facility-Administered Medications:    0.9 %  sodium chloride  infusion, , Intravenous, Continuous, Harvey Lingo, Shannon ONEIDA, MD, Last Rate: 20 mL/hr at 08/13/24 1012, 1,000 mL at 08/13/24 1012  Medications Prior to Admission  Medication Sig Dispense Refill Last Dose/Taking   AMLODIPINE BESYLATE PO Take 2.5 mg by mouth daily.   08/13/2024   Ascorbic Acid (VITAMIN C PO) Take by mouth daily.   Past Week   atorvastatin (LIPITOR) 10 MG tablet Take 10 mg by mouth at bedtime.   08/13/2024   furosemide (LASIX) 20 MG tablet Take 10 mg by mouth daily as needed.   08/12/2024 Morning   hydrOXYzine  (ATARAX ) 25 MG tablet Take 1 tablet (25 mg total) by mouth every 6 (six) hours as needed. 20 tablet 0 Past Week   Multiple Vitamins-Minerals (MULTIVITAMIN WOMEN 50+ PO) Take 1 tablet by mouth daily.   Past Week   omeprazole (PRILOSEC) 40 MG capsule Take 40 mg by mouth daily.   08/12/2024 Morning   permethrin  (ELIMITE ) 5 % cream Apply from chin down, leave on for 8-14 hours, rinse. Repeat in 1 week 60 g 0 Past Week   pregabalin (LYRICA) 75 MG capsule Take 75 mg by mouth at bedtime.   08/12/2024 Evening   rOPINIRole (REQUIP) 1 MG tablet Take 1 mg by mouth at bedtime.   08/12/2024 Bedtime   senna-docusate (SENOKOT-S) 8.6-50 MG tablet Take 1 tablet by mouth daily. 30 tablet 0 Past Week   TRAZODONE HCL PO Take 100 mg by mouth at bedtime.   08/12/2024 Bedtime   triamterene-hydrochlorothiazide (DYAZIDE) 37.5-25 MG capsule Take 1  capsule by mouth daily.   08/12/2024 Morning   meclizine (ANTIVERT) 25 MG tablet Take 25 mg by mouth 3 (three) times daily as needed for dizziness.      ondansetron  (ZOFRAN -ODT) 4 MG disintegrating tablet Take 1 tablet (4 mg total) by mouth every 8 (eight) hours as needed for nausea or vomiting. 20 tablet 0    predniSONE  (DELTASONE ) 10 MG tablet Take 1 tablet (10 mg total) by mouth daily. Take 50 mg tomorrow (day #2), 40 mg on day #3, 30 mg on day #4, 20 mg on day #5 and 10 mg on day #6 (Patient not taking: Reported on 08/13/2024) 15 tablet 0 Completed Course     Allergies  Allergen Reactions   Bupropion Nausea And Vomiting   Buspirone Hcl Nausea And Vomiting     Past Medical History:  Diagnosis Date   Acid reflux    Anxiety    Cervicalgia    Chronic thoracic back pain    due to old fracture   Constipation    DDD (degenerative disc disease), lumbar    Diverticulosis    Dyslipidemia    Fatty liver    Herniated disc, cervical    History of colonic polyps    Hypertension    Insomnia    Osteopenia    Pericarditis 2012   PONV (postoperative nausea and vomiting)    Restless leg    Skin cancer    Stenosis of right carotid artery    Vertigo  Wears contact lenses    None since cataract procedures   Wrist disorder    right - damaged tendon.  limited use    Review of systems:  Otherwise negative.    Physical Exam  Gen: Alert, oriented. Appears stated age.  HEENT: PERRLA. Lungs: No respiratory distress CV: RRR Abd: soft, benign, no masses Ext: No edema    Planned procedures: Proceed with EGD/colonoscopy. The patient understands the nature of the planned procedure, indications, risks, alternatives and potential complications including but not limited to bleeding, infection, perforation, damage to internal organs and possible oversedation/side effects from anesthesia. The patient agrees and gives consent to proceed.  Please refer to procedure notes for findings,  recommendations and patient disposition/instructions.     Shannon ONEIDA Schick, MD [facility] Gastroenterology

## 2024-08-13 NOTE — Anesthesia Postprocedure Evaluation (Signed)
 Anesthesia Post Note  Patient: Shannon Petersen  Procedure(s) Performed: COLONOSCOPY EGD (ESOPHAGOGASTRODUODENOSCOPY)  Patient location during evaluation: PACU Anesthesia Type: General Level of consciousness: awake and alert Pain management: pain level controlled Vital Signs Assessment: post-procedure vital signs reviewed and stable Respiratory status: spontaneous breathing, nonlabored ventilation, respiratory function stable and patient connected to nasal cannula oxygen Cardiovascular status: blood pressure returned to baseline and stable Postop Assessment: no apparent nausea or vomiting Anesthetic complications: no   No notable events documented.   Last Vitals:  Vitals:   08/13/24 0956 08/13/24 1059  BP: (!) 119/91 105/68  Pulse: (!) 51   Resp: 16   Temp: (!) 36.4 C 36.4 C  SpO2: 100%     Last Pain:  Vitals:   08/13/24 1059  TempSrc: Temporal  PainSc:                  Windell Farr

## 2024-08-13 NOTE — Op Note (Signed)
 [facility]astroenter[facility] Patient Name: Shannon Petersen Procedure Date: 08/13/2024 10:17 AM MRN: [id number] Account #: [id number] Date of Birth: 1950/10/16 Admit Type: Outpatient Age: 73 Room: [facility] ENDO ROOM 1 Gender: Female Note Status: Finalized Instrument Name: Colon Scope [id number] Procedure:             Colonoscopy Indications:           Rectal bleeding, Constipation Providers:             Ole Schick MD, MD Referring MD:          Jamee Grip MD (Referring MD) Medicines:             Monitored Anesthesia Care Complications:         No immediate complications. Procedure:             Pre-Anesthesia Assessment:                        - Prior to the procedure, a History and Physical was                         performed, and patient medications and allergies were                         reviewed. The patient is competent. The risks and                         benefits of the procedure and the sedation options and                         risks were discussed with the patient. All questions                         were answered and informed consent was obtained.                         Patient identification and proposed procedure were                         verified by the physician, the nurse, the                         anesthesiologist, the anesthetist and the technician                         in the endoscopy suite. Mental Status Examination:                         alert and oriented. Airway Examination: normal                         oropharyngeal airway and neck mobility. Respiratory                         Examination: clear to auscultation. CV Examination:                         normal. Prophylactic Antibiotics: The patient does not  require prophylactic antibiotics. Prior                         Anticoagulants: The patient has taken no anticoagulant                         or antiplatelet agents. ASA Grade Assessment: II -  A                         patient with mild systemic disease. After reviewing                         the risks and benefits, the patient was deemed in                         satisfactory condition to undergo the procedure. The                         anesthesia plan was to use monitored anesthesia care                         (MAC). Immediately prior to administration of                         medications, the patient was re-assessed for adequacy                         to receive sedatives. The heart rate, respiratory                         rate, oxygen saturations, blood pressure, adequacy of                         pulmonary ventilation, and response to care were                         monitored throughout the procedure. The physical                         status of the patient was re-assessed after the                         procedure.                        After obtaining informed consent, the colonoscope was                         passed under direct vision. Throughout the procedure,                         the patient's blood pressure, pulse, and oxygen                         saturations were monitored continuously. The                         Colonoscope was introduced through the anus and  advanced to the the cecum, identified by appendiceal                         orifice and ileocecal valve. The colonoscopy was                         performed without difficulty. The patient tolerated                         the procedure well. The quality of the bowel                         preparation was good. The ileocecal valve, appendiceal                         orifice, and rectum were photographed. Findings:      The perianal and digital rectal examinations were normal.      A diffuse area of mild melanosis was found in the cecum.      A few small-mouthed diverticula were found in the sigmoid colon.      Internal hemorrhoids were found during  retroflexion. The hemorrhoids       were Grade I (internal hemorrhoids that do not prolapse).      The exam was otherwise without abnormality on direct and retroflexion       views. Impression:            - Melanosis in the colon.                        - Diverticulosis in the sigmoid colon.                        - Internal hemorrhoids.                        - The examination was otherwise normal on direct and                         retroflexion views.                        - No specimens collected. Recommendation:        - Discharge patient to home.                        - Resume previous diet.                        - Continue present medications.                        - Repeat colonoscopy is not recommended due to current                         age (25 years or older) for surveillance.                        - Return to referring physician as previously                         scheduled. Procedure  Code(s):     --- Professional ---                        [phone], Colonoscopy, flexible; diagnostic, including                         collection of specimen(s) by brushing or washing, when                         performed (separate procedure) Diagnosis Code(s):     --- Professional ---                        K63.89, Other specified diseases of intestine                        K64.0, First degree hemorrhoids                        K62.5, Hemorrhage of anus and rectum                        K59.00, Constipation, unspecified                        K57.30, Diverticulosis of large intestine without                         perforation or abscess without bleeding CPT copyright 2022 American Medical Association. All rights reserved. The codes documented in this report are preliminary and upon coder review may  be revised to meet current compliance requirements. Ole Schick MD, MD 08/13/2024 11:02:09 AM Number of Addenda: 0 Note Initiated On: 08/13/2024 10:17 AM Scope Withdrawal  Time: 0 hours 8 minutes 4 seconds  Total Procedure Duration: 0 hours 11 minutes 30 seconds  Estimated Blood Loss:  Estimated blood loss: none.      [facility]

## 2024-08-14 LAB — SURGICAL PATHOLOGY
